# Patient Record
Sex: Female | Born: 1935
Health system: Southern US, Community
[De-identification: ages and names within clinical notes are randomized; demographics above are authoritative.]

## PROBLEM LIST (undated history)

## (undated) DIAGNOSIS — I10 Essential (primary) hypertension: Secondary | ICD-10-CM

## (undated) HISTORY — DX: Essential (primary) hypertension: I10

## (undated) HISTORY — PX: TIBIA FRACTURE SURGERY: SHX806

---

## 2009-07-04 ENCOUNTER — Encounter (HOSPITAL_BASED_OUTPATIENT_CLINIC_OR_DEPARTMENT_OTHER): Admission: RE | Admit: 2009-07-04 | Discharge: 2009-10-02 | Payer: Self-pay | Admitting: General Surgery

## 2009-10-04 ENCOUNTER — Encounter (HOSPITAL_BASED_OUTPATIENT_CLINIC_OR_DEPARTMENT_OTHER): Admission: RE | Admit: 2009-10-04 | Discharge: 2009-11-02 | Payer: Self-pay | Admitting: General Surgery

## 2010-01-29 ENCOUNTER — Encounter: Payer: Self-pay | Admitting: Geriatric Medicine

## 2011-02-15 DIAGNOSIS — E1129 Type 2 diabetes mellitus with other diabetic kidney complication: Secondary | ICD-10-CM | POA: Diagnosis not present

## 2011-02-15 DIAGNOSIS — E1159 Type 2 diabetes mellitus with other circulatory complications: Secondary | ICD-10-CM | POA: Diagnosis not present

## 2011-02-15 DIAGNOSIS — I739 Peripheral vascular disease, unspecified: Secondary | ICD-10-CM | POA: Diagnosis not present

## 2011-02-15 DIAGNOSIS — I129 Hypertensive chronic kidney disease with stage 1 through stage 4 chronic kidney disease, or unspecified chronic kidney disease: Secondary | ICD-10-CM | POA: Diagnosis not present

## 2011-03-05 DIAGNOSIS — E1149 Type 2 diabetes mellitus with other diabetic neurological complication: Secondary | ICD-10-CM | POA: Diagnosis not present

## 2011-03-05 DIAGNOSIS — M2040 Other hammer toe(s) (acquired), unspecified foot: Secondary | ICD-10-CM | POA: Diagnosis not present

## 2011-03-05 DIAGNOSIS — L84 Corns and callosities: Secondary | ICD-10-CM | POA: Diagnosis not present

## 2011-06-06 DIAGNOSIS — Q828 Other specified congenital malformations of skin: Secondary | ICD-10-CM | POA: Diagnosis not present

## 2011-06-06 DIAGNOSIS — E1149 Type 2 diabetes mellitus with other diabetic neurological complication: Secondary | ICD-10-CM | POA: Diagnosis not present

## 2011-06-06 DIAGNOSIS — L608 Other nail disorders: Secondary | ICD-10-CM | POA: Diagnosis not present

## 2011-06-29 DIAGNOSIS — I70299 Other atherosclerosis of native arteries of extremities, unspecified extremity: Secondary | ICD-10-CM | POA: Diagnosis not present

## 2011-06-29 DIAGNOSIS — E1159 Type 2 diabetes mellitus with other circulatory complications: Secondary | ICD-10-CM | POA: Diagnosis not present

## 2011-06-29 DIAGNOSIS — I10 Essential (primary) hypertension: Secondary | ICD-10-CM | POA: Diagnosis not present

## 2011-06-29 DIAGNOSIS — R609 Edema, unspecified: Secondary | ICD-10-CM | POA: Diagnosis not present

## 2011-07-10 DIAGNOSIS — R609 Edema, unspecified: Secondary | ICD-10-CM | POA: Diagnosis not present

## 2011-07-10 DIAGNOSIS — L0390 Cellulitis, unspecified: Secondary | ICD-10-CM | POA: Diagnosis not present

## 2011-07-10 DIAGNOSIS — Z79899 Other long term (current) drug therapy: Secondary | ICD-10-CM | POA: Diagnosis not present

## 2011-07-10 DIAGNOSIS — L0291 Cutaneous abscess, unspecified: Secondary | ICD-10-CM | POA: Diagnosis not present

## 2011-07-24 DIAGNOSIS — I129 Hypertensive chronic kidney disease with stage 1 through stage 4 chronic kidney disease, or unspecified chronic kidney disease: Secondary | ICD-10-CM | POA: Diagnosis not present

## 2011-07-24 DIAGNOSIS — R609 Edema, unspecified: Secondary | ICD-10-CM | POA: Diagnosis not present

## 2011-08-27 DIAGNOSIS — I129 Hypertensive chronic kidney disease with stage 1 through stage 4 chronic kidney disease, or unspecified chronic kidney disease: Secondary | ICD-10-CM | POA: Diagnosis not present

## 2011-08-27 DIAGNOSIS — Z1331 Encounter for screening for depression: Secondary | ICD-10-CM | POA: Diagnosis not present

## 2011-08-27 DIAGNOSIS — E1129 Type 2 diabetes mellitus with other diabetic kidney complication: Secondary | ICD-10-CM | POA: Diagnosis not present

## 2011-08-27 DIAGNOSIS — R609 Edema, unspecified: Secondary | ICD-10-CM | POA: Diagnosis not present

## 2011-09-06 DIAGNOSIS — E1149 Type 2 diabetes mellitus with other diabetic neurological complication: Secondary | ICD-10-CM | POA: Diagnosis not present

## 2011-09-06 DIAGNOSIS — M79609 Pain in unspecified limb: Secondary | ICD-10-CM | POA: Diagnosis not present

## 2011-09-06 DIAGNOSIS — L84 Corns and callosities: Secondary | ICD-10-CM | POA: Diagnosis not present

## 2011-09-06 DIAGNOSIS — B351 Tinea unguium: Secondary | ICD-10-CM | POA: Diagnosis not present

## 2011-11-23 DIAGNOSIS — I129 Hypertensive chronic kidney disease with stage 1 through stage 4 chronic kidney disease, or unspecified chronic kidney disease: Secondary | ICD-10-CM | POA: Diagnosis not present

## 2011-11-23 DIAGNOSIS — Z23 Encounter for immunization: Secondary | ICD-10-CM | POA: Diagnosis not present

## 2011-11-23 DIAGNOSIS — E1129 Type 2 diabetes mellitus with other diabetic kidney complication: Secondary | ICD-10-CM | POA: Diagnosis not present

## 2011-12-05 DIAGNOSIS — Q828 Other specified congenital malformations of skin: Secondary | ICD-10-CM | POA: Diagnosis not present

## 2012-01-11 DIAGNOSIS — H2510 Age-related nuclear cataract, unspecified eye: Secondary | ICD-10-CM | POA: Diagnosis not present

## 2012-04-04 DIAGNOSIS — Z1331 Encounter for screening for depression: Secondary | ICD-10-CM | POA: Diagnosis not present

## 2012-04-04 DIAGNOSIS — Z0000 Encounter for general adult medical examination without abnormal findings: Secondary | ICD-10-CM | POA: Diagnosis not present

## 2012-04-04 DIAGNOSIS — H9190 Unspecified hearing loss, unspecified ear: Secondary | ICD-10-CM | POA: Diagnosis not present

## 2012-04-24 DIAGNOSIS — M79609 Pain in unspecified limb: Secondary | ICD-10-CM | POA: Diagnosis not present

## 2012-04-24 DIAGNOSIS — E1149 Type 2 diabetes mellitus with other diabetic neurological complication: Secondary | ICD-10-CM | POA: Diagnosis not present

## 2012-04-24 DIAGNOSIS — B351 Tinea unguium: Secondary | ICD-10-CM | POA: Diagnosis not present

## 2012-04-24 DIAGNOSIS — Q828 Other specified congenital malformations of skin: Secondary | ICD-10-CM | POA: Diagnosis not present

## 2012-07-02 DIAGNOSIS — E1149 Type 2 diabetes mellitus with other diabetic neurological complication: Secondary | ICD-10-CM | POA: Diagnosis not present

## 2012-07-02 DIAGNOSIS — B351 Tinea unguium: Secondary | ICD-10-CM | POA: Diagnosis not present

## 2012-07-02 DIAGNOSIS — M79609 Pain in unspecified limb: Secondary | ICD-10-CM | POA: Diagnosis not present

## 2012-07-02 DIAGNOSIS — Q828 Other specified congenital malformations of skin: Secondary | ICD-10-CM | POA: Diagnosis not present

## 2012-07-29 DIAGNOSIS — E7800 Pure hypercholesterolemia, unspecified: Secondary | ICD-10-CM | POA: Diagnosis not present

## 2012-07-29 DIAGNOSIS — I129 Hypertensive chronic kidney disease with stage 1 through stage 4 chronic kidney disease, or unspecified chronic kidney disease: Secondary | ICD-10-CM | POA: Diagnosis not present

## 2012-07-29 DIAGNOSIS — I739 Peripheral vascular disease, unspecified: Secondary | ICD-10-CM | POA: Diagnosis not present

## 2012-07-29 DIAGNOSIS — Z79899 Other long term (current) drug therapy: Secondary | ICD-10-CM | POA: Diagnosis not present

## 2012-07-29 DIAGNOSIS — E1129 Type 2 diabetes mellitus with other diabetic kidney complication: Secondary | ICD-10-CM | POA: Diagnosis not present

## 2012-07-29 DIAGNOSIS — I872 Venous insufficiency (chronic) (peripheral): Secondary | ICD-10-CM | POA: Diagnosis not present

## 2012-07-29 DIAGNOSIS — E1159 Type 2 diabetes mellitus with other circulatory complications: Secondary | ICD-10-CM | POA: Diagnosis not present

## 2012-09-03 DIAGNOSIS — M79609 Pain in unspecified limb: Secondary | ICD-10-CM | POA: Diagnosis not present

## 2012-09-03 DIAGNOSIS — B351 Tinea unguium: Secondary | ICD-10-CM | POA: Diagnosis not present

## 2012-09-03 DIAGNOSIS — E1149 Type 2 diabetes mellitus with other diabetic neurological complication: Secondary | ICD-10-CM | POA: Diagnosis not present

## 2012-09-03 DIAGNOSIS — Q828 Other specified congenital malformations of skin: Secondary | ICD-10-CM | POA: Diagnosis not present

## 2012-09-17 DIAGNOSIS — L02419 Cutaneous abscess of limb, unspecified: Secondary | ICD-10-CM | POA: Diagnosis not present

## 2012-09-17 DIAGNOSIS — T148XXA Other injury of unspecified body region, initial encounter: Secondary | ICD-10-CM | POA: Diagnosis not present

## 2012-09-20 DIAGNOSIS — L02419 Cutaneous abscess of limb, unspecified: Secondary | ICD-10-CM | POA: Diagnosis not present

## 2012-10-03 DIAGNOSIS — R609 Edema, unspecified: Secondary | ICD-10-CM | POA: Diagnosis not present

## 2012-10-03 DIAGNOSIS — E119 Type 2 diabetes mellitus without complications: Secondary | ICD-10-CM | POA: Diagnosis not present

## 2012-10-03 DIAGNOSIS — T148XXA Other injury of unspecified body region, initial encounter: Secondary | ICD-10-CM | POA: Diagnosis not present

## 2012-10-03 DIAGNOSIS — I10 Essential (primary) hypertension: Secondary | ICD-10-CM | POA: Diagnosis not present

## 2012-11-04 DIAGNOSIS — Z23 Encounter for immunization: Secondary | ICD-10-CM | POA: Diagnosis not present

## 2012-11-04 DIAGNOSIS — R5381 Other malaise: Secondary | ICD-10-CM | POA: Diagnosis not present

## 2012-11-04 DIAGNOSIS — I872 Venous insufficiency (chronic) (peripheral): Secondary | ICD-10-CM | POA: Diagnosis not present

## 2012-11-04 DIAGNOSIS — I83009 Varicose veins of unspecified lower extremity with ulcer of unspecified site: Secondary | ICD-10-CM | POA: Diagnosis not present

## 2012-11-26 ENCOUNTER — Ambulatory Visit (INDEPENDENT_AMBULATORY_CARE_PROVIDER_SITE_OTHER): Payer: Medicare Other | Admitting: Podiatry

## 2012-11-26 ENCOUNTER — Encounter: Payer: Self-pay | Admitting: Podiatry

## 2012-11-26 VITALS — BP 163/84 | HR 74 | Resp 16

## 2012-11-26 DIAGNOSIS — B351 Tinea unguium: Secondary | ICD-10-CM

## 2012-11-26 DIAGNOSIS — Q828 Other specified congenital malformations of skin: Secondary | ICD-10-CM | POA: Diagnosis not present

## 2012-11-26 DIAGNOSIS — E1159 Type 2 diabetes mellitus with other circulatory complications: Secondary | ICD-10-CM | POA: Diagnosis not present

## 2012-11-26 DIAGNOSIS — M79609 Pain in unspecified limb: Secondary | ICD-10-CM | POA: Diagnosis not present

## 2012-11-26 NOTE — Progress Notes (Signed)
Subjective:     Patient ID: Valerie Garrison, female   DOB: 1916-08-18, 79 y.o.   MRN: [id number]  HPI patient states my nails are thickened and painful on both feet   Review of Systems     Objective:   Physical Exam Neurovascular status unchanged with thick deformed nailbeds 1-5 both feet that are impossible for her to cut and painful    Assessment:     Mycotic nail infection with pain 1-5 both feet    Plan:     Debridement painful nail bed 1-5 both feet with no bleeding noted

## 2013-02-25 ENCOUNTER — Ambulatory Visit: Payer: Medicare Other | Admitting: Podiatry

## 2013-03-04 ENCOUNTER — Ambulatory Visit: Payer: Medicare Other | Admitting: Podiatry

## 2013-03-23 ENCOUNTER — Encounter: Payer: Self-pay | Admitting: Podiatry

## 2013-03-23 ENCOUNTER — Ambulatory Visit (INDEPENDENT_AMBULATORY_CARE_PROVIDER_SITE_OTHER): Payer: Medicare Other | Admitting: Podiatry

## 2013-03-23 VITALS — BP 132/60 | HR 80 | Resp 12

## 2013-03-23 DIAGNOSIS — B351 Tinea unguium: Secondary | ICD-10-CM | POA: Diagnosis not present

## 2013-03-23 DIAGNOSIS — L84 Corns and callosities: Secondary | ICD-10-CM

## 2013-03-23 DIAGNOSIS — M79609 Pain in unspecified limb: Secondary | ICD-10-CM

## 2013-03-25 DIAGNOSIS — Z23 Encounter for immunization: Secondary | ICD-10-CM | POA: Diagnosis not present

## 2013-03-25 DIAGNOSIS — I70299 Other atherosclerosis of native arteries of extremities, unspecified extremity: Secondary | ICD-10-CM | POA: Diagnosis not present

## 2013-03-25 DIAGNOSIS — E1159 Type 2 diabetes mellitus with other circulatory complications: Secondary | ICD-10-CM | POA: Diagnosis not present

## 2013-03-25 DIAGNOSIS — I10 Essential (primary) hypertension: Secondary | ICD-10-CM | POA: Diagnosis not present

## 2013-03-25 NOTE — Progress Notes (Signed)
Subjective:     Patient ID: Valerie Garrison, female   DOB: [birth year], 80 y.o.   MRN: [id number]  HPI patient presents with painful thick nailbeds 1-5 both feet that she cannot cut them lesions under both feet that she can not take care of   Review of Systems     Objective:   Physical Exam Neurovascular status intact with nail disease 1-5 both feet that are painful and lesion left    Assessment:     Mycotic nail infection 1-5 both feet and lesion left foot    Plan:     Debridement painful nailbeds 1-5 both feet with no bleeding and lesion left with no bleeding noted

## 2013-04-02 DIAGNOSIS — W19XXXA Unspecified fall, initial encounter: Secondary | ICD-10-CM | POA: Diagnosis not present

## 2013-04-02 DIAGNOSIS — M79609 Pain in unspecified limb: Secondary | ICD-10-CM | POA: Diagnosis not present

## 2013-04-02 DIAGNOSIS — R0902 Hypoxemia: Secondary | ICD-10-CM | POA: Diagnosis not present

## 2013-04-02 DIAGNOSIS — T8062XA Other serum reaction due to vaccination, initial encounter: Secondary | ICD-10-CM | POA: Diagnosis not present

## 2013-04-02 DIAGNOSIS — I499 Cardiac arrhythmia, unspecified: Secondary | ICD-10-CM | POA: Diagnosis not present

## 2013-05-05 DIAGNOSIS — L98499 Non-pressure chronic ulcer of skin of other sites with unspecified severity: Secondary | ICD-10-CM | POA: Diagnosis not present

## 2013-05-05 DIAGNOSIS — L0291 Cutaneous abscess, unspecified: Secondary | ICD-10-CM | POA: Diagnosis not present

## 2013-05-05 DIAGNOSIS — L0390 Cellulitis, unspecified: Secondary | ICD-10-CM | POA: Diagnosis not present

## 2013-05-06 DIAGNOSIS — I83009 Varicose veins of unspecified lower extremity with ulcer of unspecified site: Secondary | ICD-10-CM | POA: Diagnosis not present

## 2013-05-06 DIAGNOSIS — R5383 Other fatigue: Secondary | ICD-10-CM | POA: Diagnosis not present

## 2013-05-06 DIAGNOSIS — L97909 Non-pressure chronic ulcer of unspecified part of unspecified lower leg with unspecified severity: Secondary | ICD-10-CM | POA: Diagnosis not present

## 2013-05-06 DIAGNOSIS — R269 Unspecified abnormalities of gait and mobility: Secondary | ICD-10-CM | POA: Diagnosis not present

## 2013-05-06 DIAGNOSIS — R5381 Other malaise: Secondary | ICD-10-CM | POA: Diagnosis not present

## 2013-05-15 ENCOUNTER — Ambulatory Visit
Admission: RE | Admit: 2013-05-15 | Discharge: 2013-05-15 | Disposition: A | Discharge status: Home / Self Care | Payer: Medicare Other | Source: Ambulatory Visit | Attending: Geriatric Medicine | Admitting: Geriatric Medicine

## 2013-05-15 ENCOUNTER — Other Ambulatory Visit: Payer: Self-pay | Admitting: Geriatric Medicine

## 2013-05-15 DIAGNOSIS — I83009 Varicose veins of unspecified lower extremity with ulcer of unspecified site: Secondary | ICD-10-CM | POA: Diagnosis not present

## 2013-05-15 DIAGNOSIS — M7989 Other specified soft tissue disorders: Secondary | ICD-10-CM | POA: Diagnosis not present

## 2013-05-15 DIAGNOSIS — M79604 Pain in right leg: Secondary | ICD-10-CM

## 2013-05-15 DIAGNOSIS — M79609 Pain in unspecified limb: Secondary | ICD-10-CM | POA: Diagnosis not present

## 2013-05-18 DIAGNOSIS — I129 Hypertensive chronic kidney disease with stage 1 through stage 4 chronic kidney disease, or unspecified chronic kidney disease: Secondary | ICD-10-CM | POA: Diagnosis not present

## 2013-05-18 DIAGNOSIS — N183 Chronic kidney disease, stage 3 unspecified: Secondary | ICD-10-CM | POA: Diagnosis not present

## 2013-05-18 DIAGNOSIS — E1159 Type 2 diabetes mellitus with other circulatory complications: Secondary | ICD-10-CM | POA: Diagnosis not present

## 2013-05-18 DIAGNOSIS — IMO0001 Reserved for inherently not codable concepts without codable children: Secondary | ICD-10-CM | POA: Diagnosis not present

## 2013-05-18 DIAGNOSIS — R269 Unspecified abnormalities of gait and mobility: Secondary | ICD-10-CM | POA: Diagnosis not present

## 2013-05-18 DIAGNOSIS — I798 Other disorders of arteries, arterioles and capillaries in diseases classified elsewhere: Secondary | ICD-10-CM | POA: Diagnosis not present

## 2013-05-20 DIAGNOSIS — IMO0001 Reserved for inherently not codable concepts without codable children: Secondary | ICD-10-CM | POA: Diagnosis not present

## 2013-05-20 DIAGNOSIS — N183 Chronic kidney disease, stage 3 unspecified: Secondary | ICD-10-CM | POA: Diagnosis not present

## 2013-05-20 DIAGNOSIS — E1159 Type 2 diabetes mellitus with other circulatory complications: Secondary | ICD-10-CM | POA: Diagnosis not present

## 2013-05-20 DIAGNOSIS — I129 Hypertensive chronic kidney disease with stage 1 through stage 4 chronic kidney disease, or unspecified chronic kidney disease: Secondary | ICD-10-CM | POA: Diagnosis not present

## 2013-05-20 DIAGNOSIS — I798 Other disorders of arteries, arterioles and capillaries in diseases classified elsewhere: Secondary | ICD-10-CM | POA: Diagnosis not present

## 2013-05-20 DIAGNOSIS — R269 Unspecified abnormalities of gait and mobility: Secondary | ICD-10-CM | POA: Diagnosis not present

## 2013-05-22 DIAGNOSIS — I129 Hypertensive chronic kidney disease with stage 1 through stage 4 chronic kidney disease, or unspecified chronic kidney disease: Secondary | ICD-10-CM | POA: Diagnosis not present

## 2013-05-22 DIAGNOSIS — IMO0001 Reserved for inherently not codable concepts without codable children: Secondary | ICD-10-CM | POA: Diagnosis not present

## 2013-05-22 DIAGNOSIS — R269 Unspecified abnormalities of gait and mobility: Secondary | ICD-10-CM | POA: Diagnosis not present

## 2013-05-22 DIAGNOSIS — E1159 Type 2 diabetes mellitus with other circulatory complications: Secondary | ICD-10-CM | POA: Diagnosis not present

## 2013-05-22 DIAGNOSIS — I798 Other disorders of arteries, arterioles and capillaries in diseases classified elsewhere: Secondary | ICD-10-CM | POA: Diagnosis not present

## 2013-05-22 DIAGNOSIS — N183 Chronic kidney disease, stage 3 unspecified: Secondary | ICD-10-CM | POA: Diagnosis not present

## 2013-05-25 DIAGNOSIS — I129 Hypertensive chronic kidney disease with stage 1 through stage 4 chronic kidney disease, or unspecified chronic kidney disease: Secondary | ICD-10-CM | POA: Diagnosis not present

## 2013-05-25 DIAGNOSIS — E1159 Type 2 diabetes mellitus with other circulatory complications: Secondary | ICD-10-CM | POA: Diagnosis not present

## 2013-05-25 DIAGNOSIS — IMO0001 Reserved for inherently not codable concepts without codable children: Secondary | ICD-10-CM | POA: Diagnosis not present

## 2013-05-25 DIAGNOSIS — I798 Other disorders of arteries, arterioles and capillaries in diseases classified elsewhere: Secondary | ICD-10-CM | POA: Diagnosis not present

## 2013-05-25 DIAGNOSIS — R269 Unspecified abnormalities of gait and mobility: Secondary | ICD-10-CM | POA: Diagnosis not present

## 2013-05-25 DIAGNOSIS — N183 Chronic kidney disease, stage 3 unspecified: Secondary | ICD-10-CM | POA: Diagnosis not present

## 2013-05-27 DIAGNOSIS — I798 Other disorders of arteries, arterioles and capillaries in diseases classified elsewhere: Secondary | ICD-10-CM | POA: Diagnosis not present

## 2013-05-27 DIAGNOSIS — IMO0001 Reserved for inherently not codable concepts without codable children: Secondary | ICD-10-CM | POA: Diagnosis not present

## 2013-05-27 DIAGNOSIS — R269 Unspecified abnormalities of gait and mobility: Secondary | ICD-10-CM | POA: Diagnosis not present

## 2013-05-27 DIAGNOSIS — I129 Hypertensive chronic kidney disease with stage 1 through stage 4 chronic kidney disease, or unspecified chronic kidney disease: Secondary | ICD-10-CM | POA: Diagnosis not present

## 2013-05-27 DIAGNOSIS — N183 Chronic kidney disease, stage 3 unspecified: Secondary | ICD-10-CM | POA: Diagnosis not present

## 2013-05-27 DIAGNOSIS — E1159 Type 2 diabetes mellitus with other circulatory complications: Secondary | ICD-10-CM | POA: Diagnosis not present

## 2013-05-29 DIAGNOSIS — L988 Other specified disorders of the skin and subcutaneous tissue: Secondary | ICD-10-CM | POA: Diagnosis present

## 2013-05-29 DIAGNOSIS — I129 Hypertensive chronic kidney disease with stage 1 through stage 4 chronic kidney disease, or unspecified chronic kidney disease: Secondary | ICD-10-CM | POA: Diagnosis present

## 2013-05-29 DIAGNOSIS — I798 Other disorders of arteries, arterioles and capillaries in diseases classified elsewhere: Secondary | ICD-10-CM | POA: Diagnosis not present

## 2013-05-29 DIAGNOSIS — I83009 Varicose veins of unspecified lower extremity with ulcer of unspecified site: Secondary | ICD-10-CM | POA: Diagnosis not present

## 2013-05-29 DIAGNOSIS — Z79899 Other long term (current) drug therapy: Secondary | ICD-10-CM | POA: Diagnosis not present

## 2013-05-29 DIAGNOSIS — IMO0001 Reserved for inherently not codable concepts without codable children: Secondary | ICD-10-CM | POA: Diagnosis not present

## 2013-05-29 DIAGNOSIS — N183 Chronic kidney disease, stage 3 unspecified: Secondary | ICD-10-CM | POA: Diagnosis not present

## 2013-05-29 DIAGNOSIS — R51 Headache: Secondary | ICD-10-CM | POA: Diagnosis not present

## 2013-05-29 DIAGNOSIS — B370 Candidal stomatitis: Secondary | ICD-10-CM | POA: Diagnosis present

## 2013-05-29 DIAGNOSIS — R5383 Other fatigue: Secondary | ICD-10-CM | POA: Diagnosis not present

## 2013-05-29 DIAGNOSIS — R0602 Shortness of breath: Secondary | ICD-10-CM | POA: Diagnosis not present

## 2013-05-29 DIAGNOSIS — R4182 Altered mental status, unspecified: Secondary | ICD-10-CM | POA: Diagnosis not present

## 2013-05-29 DIAGNOSIS — R7309 Other abnormal glucose: Secondary | ICD-10-CM | POA: Diagnosis not present

## 2013-05-29 DIAGNOSIS — I872 Venous insufficiency (chronic) (peripheral): Secondary | ICD-10-CM | POA: Diagnosis present

## 2013-05-29 DIAGNOSIS — G9341 Metabolic encephalopathy: Secondary | ICD-10-CM | POA: Diagnosis not present

## 2013-05-29 DIAGNOSIS — L02419 Cutaneous abscess of limb, unspecified: Secondary | ICD-10-CM | POA: Diagnosis not present

## 2013-05-29 DIAGNOSIS — L97909 Non-pressure chronic ulcer of unspecified part of unspecified lower leg with unspecified severity: Secondary | ICD-10-CM | POA: Diagnosis not present

## 2013-05-29 DIAGNOSIS — R5381 Other malaise: Secondary | ICD-10-CM | POA: Diagnosis not present

## 2013-05-29 DIAGNOSIS — N179 Acute kidney failure, unspecified: Secondary | ICD-10-CM | POA: Diagnosis present

## 2013-05-29 DIAGNOSIS — E1159 Type 2 diabetes mellitus with other circulatory complications: Secondary | ICD-10-CM | POA: Diagnosis not present

## 2013-05-29 DIAGNOSIS — F29 Unspecified psychosis not due to a substance or known physiological condition: Secondary | ICD-10-CM | POA: Diagnosis not present

## 2013-05-29 DIAGNOSIS — G3184 Mild cognitive impairment, so stated: Secondary | ICD-10-CM | POA: Diagnosis present

## 2013-05-29 DIAGNOSIS — R059 Cough, unspecified: Secondary | ICD-10-CM | POA: Diagnosis not present

## 2013-05-29 DIAGNOSIS — Z7982 Long term (current) use of aspirin: Secondary | ICD-10-CM | POA: Diagnosis not present

## 2013-05-29 DIAGNOSIS — R269 Unspecified abnormalities of gait and mobility: Secondary | ICD-10-CM | POA: Diagnosis not present

## 2013-06-01 DIAGNOSIS — I10 Essential (primary) hypertension: Secondary | ICD-10-CM | POA: Diagnosis not present

## 2013-06-01 DIAGNOSIS — Z7982 Long term (current) use of aspirin: Secondary | ICD-10-CM | POA: Diagnosis not present

## 2013-06-01 DIAGNOSIS — R7309 Other abnormal glucose: Secondary | ICD-10-CM | POA: Diagnosis not present

## 2013-06-01 DIAGNOSIS — IMO0001 Reserved for inherently not codable concepts without codable children: Secondary | ICD-10-CM | POA: Diagnosis not present

## 2013-06-01 DIAGNOSIS — E119 Type 2 diabetes mellitus without complications: Secondary | ICD-10-CM | POA: Diagnosis not present

## 2013-06-01 DIAGNOSIS — R609 Edema, unspecified: Secondary | ICD-10-CM | POA: Diagnosis not present

## 2013-06-01 DIAGNOSIS — Z794 Long term (current) use of insulin: Secondary | ICD-10-CM | POA: Diagnosis not present

## 2013-06-01 DIAGNOSIS — N183 Chronic kidney disease, stage 3 unspecified: Secondary | ICD-10-CM | POA: Diagnosis not present

## 2013-06-01 DIAGNOSIS — E1159 Type 2 diabetes mellitus with other circulatory complications: Secondary | ICD-10-CM | POA: Diagnosis not present

## 2013-06-01 DIAGNOSIS — I798 Other disorders of arteries, arterioles and capillaries in diseases classified elsewhere: Secondary | ICD-10-CM | POA: Diagnosis not present

## 2013-06-01 DIAGNOSIS — R269 Unspecified abnormalities of gait and mobility: Secondary | ICD-10-CM | POA: Diagnosis not present

## 2013-06-01 DIAGNOSIS — N289 Disorder of kidney and ureter, unspecified: Secondary | ICD-10-CM | POA: Diagnosis not present

## 2013-06-01 DIAGNOSIS — N390 Urinary tract infection, site not specified: Secondary | ICD-10-CM | POA: Diagnosis not present

## 2013-06-01 DIAGNOSIS — F29 Unspecified psychosis not due to a substance or known physiological condition: Secondary | ICD-10-CM | POA: Diagnosis not present

## 2013-06-01 DIAGNOSIS — I129 Hypertensive chronic kidney disease with stage 1 through stage 4 chronic kidney disease, or unspecified chronic kidney disease: Secondary | ICD-10-CM | POA: Diagnosis not present

## 2013-06-01 DIAGNOSIS — Z79899 Other long term (current) drug therapy: Secondary | ICD-10-CM | POA: Diagnosis not present

## 2013-06-01 DIAGNOSIS — R631 Polydipsia: Secondary | ICD-10-CM | POA: Diagnosis not present

## 2013-06-02 DIAGNOSIS — N183 Chronic kidney disease, stage 3 unspecified: Secondary | ICD-10-CM | POA: Diagnosis not present

## 2013-06-02 DIAGNOSIS — R269 Unspecified abnormalities of gait and mobility: Secondary | ICD-10-CM | POA: Diagnosis not present

## 2013-06-02 DIAGNOSIS — I798 Other disorders of arteries, arterioles and capillaries in diseases classified elsewhere: Secondary | ICD-10-CM | POA: Diagnosis not present

## 2013-06-02 DIAGNOSIS — IMO0001 Reserved for inherently not codable concepts without codable children: Secondary | ICD-10-CM | POA: Diagnosis not present

## 2013-06-02 DIAGNOSIS — E1159 Type 2 diabetes mellitus with other circulatory complications: Secondary | ICD-10-CM | POA: Diagnosis not present

## 2013-06-02 DIAGNOSIS — I129 Hypertensive chronic kidney disease with stage 1 through stage 4 chronic kidney disease, or unspecified chronic kidney disease: Secondary | ICD-10-CM | POA: Diagnosis not present

## 2013-06-03 DIAGNOSIS — E1159 Type 2 diabetes mellitus with other circulatory complications: Secondary | ICD-10-CM | POA: Diagnosis not present

## 2013-06-03 DIAGNOSIS — L03119 Cellulitis of unspecified part of limb: Secondary | ICD-10-CM | POA: Diagnosis not present

## 2013-06-03 DIAGNOSIS — R413 Other amnesia: Secondary | ICD-10-CM | POA: Diagnosis not present

## 2013-06-03 DIAGNOSIS — I129 Hypertensive chronic kidney disease with stage 1 through stage 4 chronic kidney disease, or unspecified chronic kidney disease: Secondary | ICD-10-CM | POA: Diagnosis not present

## 2013-06-03 DIAGNOSIS — I70299 Other atherosclerosis of native arteries of extremities, unspecified extremity: Secondary | ICD-10-CM | POA: Diagnosis not present

## 2013-06-03 DIAGNOSIS — N183 Chronic kidney disease, stage 3 unspecified: Secondary | ICD-10-CM | POA: Diagnosis not present

## 2013-06-03 DIAGNOSIS — L02419 Cutaneous abscess of limb, unspecified: Secondary | ICD-10-CM | POA: Diagnosis not present

## 2013-06-03 DIAGNOSIS — E1129 Type 2 diabetes mellitus with other diabetic kidney complication: Secondary | ICD-10-CM | POA: Diagnosis not present

## 2013-06-05 DIAGNOSIS — E1159 Type 2 diabetes mellitus with other circulatory complications: Secondary | ICD-10-CM | POA: Diagnosis not present

## 2013-06-05 DIAGNOSIS — R269 Unspecified abnormalities of gait and mobility: Secondary | ICD-10-CM | POA: Diagnosis not present

## 2013-06-05 DIAGNOSIS — IMO0001 Reserved for inherently not codable concepts without codable children: Secondary | ICD-10-CM | POA: Diagnosis not present

## 2013-06-05 DIAGNOSIS — I129 Hypertensive chronic kidney disease with stage 1 through stage 4 chronic kidney disease, or unspecified chronic kidney disease: Secondary | ICD-10-CM | POA: Diagnosis not present

## 2013-06-05 DIAGNOSIS — I798 Other disorders of arteries, arterioles and capillaries in diseases classified elsewhere: Secondary | ICD-10-CM | POA: Diagnosis not present

## 2013-06-05 DIAGNOSIS — N183 Chronic kidney disease, stage 3 unspecified: Secondary | ICD-10-CM | POA: Diagnosis not present

## 2013-06-08 DIAGNOSIS — I798 Other disorders of arteries, arterioles and capillaries in diseases classified elsewhere: Secondary | ICD-10-CM | POA: Diagnosis not present

## 2013-06-08 DIAGNOSIS — E1159 Type 2 diabetes mellitus with other circulatory complications: Secondary | ICD-10-CM | POA: Diagnosis not present

## 2013-06-08 DIAGNOSIS — I129 Hypertensive chronic kidney disease with stage 1 through stage 4 chronic kidney disease, or unspecified chronic kidney disease: Secondary | ICD-10-CM | POA: Diagnosis not present

## 2013-06-08 DIAGNOSIS — N183 Chronic kidney disease, stage 3 unspecified: Secondary | ICD-10-CM | POA: Diagnosis not present

## 2013-06-08 DIAGNOSIS — R269 Unspecified abnormalities of gait and mobility: Secondary | ICD-10-CM | POA: Diagnosis not present

## 2013-06-08 DIAGNOSIS — IMO0001 Reserved for inherently not codable concepts without codable children: Secondary | ICD-10-CM | POA: Diagnosis not present

## 2013-06-10 DIAGNOSIS — N183 Chronic kidney disease, stage 3 unspecified: Secondary | ICD-10-CM | POA: Diagnosis not present

## 2013-06-10 DIAGNOSIS — R269 Unspecified abnormalities of gait and mobility: Secondary | ICD-10-CM | POA: Diagnosis not present

## 2013-06-10 DIAGNOSIS — I129 Hypertensive chronic kidney disease with stage 1 through stage 4 chronic kidney disease, or unspecified chronic kidney disease: Secondary | ICD-10-CM | POA: Diagnosis not present

## 2013-06-10 DIAGNOSIS — E1159 Type 2 diabetes mellitus with other circulatory complications: Secondary | ICD-10-CM | POA: Diagnosis not present

## 2013-06-10 DIAGNOSIS — I798 Other disorders of arteries, arterioles and capillaries in diseases classified elsewhere: Secondary | ICD-10-CM | POA: Diagnosis not present

## 2013-06-10 DIAGNOSIS — IMO0001 Reserved for inherently not codable concepts without codable children: Secondary | ICD-10-CM | POA: Diagnosis not present

## 2013-06-11 DIAGNOSIS — I798 Other disorders of arteries, arterioles and capillaries in diseases classified elsewhere: Secondary | ICD-10-CM | POA: Diagnosis not present

## 2013-06-11 DIAGNOSIS — IMO0001 Reserved for inherently not codable concepts without codable children: Secondary | ICD-10-CM | POA: Diagnosis not present

## 2013-06-11 DIAGNOSIS — N183 Chronic kidney disease, stage 3 unspecified: Secondary | ICD-10-CM | POA: Diagnosis not present

## 2013-06-11 DIAGNOSIS — R269 Unspecified abnormalities of gait and mobility: Secondary | ICD-10-CM | POA: Diagnosis not present

## 2013-06-11 DIAGNOSIS — E1159 Type 2 diabetes mellitus with other circulatory complications: Secondary | ICD-10-CM | POA: Diagnosis not present

## 2013-06-11 DIAGNOSIS — I129 Hypertensive chronic kidney disease with stage 1 through stage 4 chronic kidney disease, or unspecified chronic kidney disease: Secondary | ICD-10-CM | POA: Diagnosis not present

## 2013-06-16 DIAGNOSIS — I798 Other disorders of arteries, arterioles and capillaries in diseases classified elsewhere: Secondary | ICD-10-CM | POA: Diagnosis not present

## 2013-06-16 DIAGNOSIS — N183 Chronic kidney disease, stage 3 unspecified: Secondary | ICD-10-CM | POA: Diagnosis not present

## 2013-06-16 DIAGNOSIS — IMO0001 Reserved for inherently not codable concepts without codable children: Secondary | ICD-10-CM | POA: Diagnosis not present

## 2013-06-16 DIAGNOSIS — I129 Hypertensive chronic kidney disease with stage 1 through stage 4 chronic kidney disease, or unspecified chronic kidney disease: Secondary | ICD-10-CM | POA: Diagnosis not present

## 2013-06-16 DIAGNOSIS — R269 Unspecified abnormalities of gait and mobility: Secondary | ICD-10-CM | POA: Diagnosis not present

## 2013-06-16 DIAGNOSIS — E1159 Type 2 diabetes mellitus with other circulatory complications: Secondary | ICD-10-CM | POA: Diagnosis not present

## 2013-06-17 DIAGNOSIS — I872 Venous insufficiency (chronic) (peripheral): Secondary | ICD-10-CM | POA: Diagnosis not present

## 2013-06-17 DIAGNOSIS — N183 Chronic kidney disease, stage 3 unspecified: Secondary | ICD-10-CM | POA: Diagnosis not present

## 2013-06-17 DIAGNOSIS — I129 Hypertensive chronic kidney disease with stage 1 through stage 4 chronic kidney disease, or unspecified chronic kidney disease: Secondary | ICD-10-CM | POA: Diagnosis not present

## 2013-06-17 DIAGNOSIS — E1129 Type 2 diabetes mellitus with other diabetic kidney complication: Secondary | ICD-10-CM | POA: Diagnosis not present

## 2013-06-18 DIAGNOSIS — IMO0001 Reserved for inherently not codable concepts without codable children: Secondary | ICD-10-CM | POA: Diagnosis not present

## 2013-06-18 DIAGNOSIS — I129 Hypertensive chronic kidney disease with stage 1 through stage 4 chronic kidney disease, or unspecified chronic kidney disease: Secondary | ICD-10-CM | POA: Diagnosis not present

## 2013-06-18 DIAGNOSIS — I798 Other disorders of arteries, arterioles and capillaries in diseases classified elsewhere: Secondary | ICD-10-CM | POA: Diagnosis not present

## 2013-06-18 DIAGNOSIS — R269 Unspecified abnormalities of gait and mobility: Secondary | ICD-10-CM | POA: Diagnosis not present

## 2013-06-18 DIAGNOSIS — N183 Chronic kidney disease, stage 3 unspecified: Secondary | ICD-10-CM | POA: Diagnosis not present

## 2013-06-18 DIAGNOSIS — E1159 Type 2 diabetes mellitus with other circulatory complications: Secondary | ICD-10-CM | POA: Diagnosis not present

## 2013-06-19 DIAGNOSIS — IMO0001 Reserved for inherently not codable concepts without codable children: Secondary | ICD-10-CM | POA: Diagnosis not present

## 2013-06-19 DIAGNOSIS — N183 Chronic kidney disease, stage 3 unspecified: Secondary | ICD-10-CM | POA: Diagnosis not present

## 2013-06-19 DIAGNOSIS — I798 Other disorders of arteries, arterioles and capillaries in diseases classified elsewhere: Secondary | ICD-10-CM | POA: Diagnosis not present

## 2013-06-19 DIAGNOSIS — I129 Hypertensive chronic kidney disease with stage 1 through stage 4 chronic kidney disease, or unspecified chronic kidney disease: Secondary | ICD-10-CM | POA: Diagnosis not present

## 2013-06-19 DIAGNOSIS — R269 Unspecified abnormalities of gait and mobility: Secondary | ICD-10-CM | POA: Diagnosis not present

## 2013-06-19 DIAGNOSIS — E1159 Type 2 diabetes mellitus with other circulatory complications: Secondary | ICD-10-CM | POA: Diagnosis not present

## 2013-06-22 DIAGNOSIS — I798 Other disorders of arteries, arterioles and capillaries in diseases classified elsewhere: Secondary | ICD-10-CM | POA: Diagnosis not present

## 2013-06-22 DIAGNOSIS — IMO0001 Reserved for inherently not codable concepts without codable children: Secondary | ICD-10-CM | POA: Diagnosis not present

## 2013-06-22 DIAGNOSIS — R269 Unspecified abnormalities of gait and mobility: Secondary | ICD-10-CM | POA: Diagnosis not present

## 2013-06-22 DIAGNOSIS — I129 Hypertensive chronic kidney disease with stage 1 through stage 4 chronic kidney disease, or unspecified chronic kidney disease: Secondary | ICD-10-CM | POA: Diagnosis not present

## 2013-06-22 DIAGNOSIS — N183 Chronic kidney disease, stage 3 unspecified: Secondary | ICD-10-CM | POA: Diagnosis not present

## 2013-06-22 DIAGNOSIS — E1159 Type 2 diabetes mellitus with other circulatory complications: Secondary | ICD-10-CM | POA: Diagnosis not present

## 2013-06-23 DIAGNOSIS — I798 Other disorders of arteries, arterioles and capillaries in diseases classified elsewhere: Secondary | ICD-10-CM | POA: Diagnosis not present

## 2013-06-23 DIAGNOSIS — I129 Hypertensive chronic kidney disease with stage 1 through stage 4 chronic kidney disease, or unspecified chronic kidney disease: Secondary | ICD-10-CM | POA: Diagnosis not present

## 2013-06-23 DIAGNOSIS — N183 Chronic kidney disease, stage 3 unspecified: Secondary | ICD-10-CM | POA: Diagnosis not present

## 2013-06-23 DIAGNOSIS — IMO0001 Reserved for inherently not codable concepts without codable children: Secondary | ICD-10-CM | POA: Diagnosis not present

## 2013-06-23 DIAGNOSIS — E1159 Type 2 diabetes mellitus with other circulatory complications: Secondary | ICD-10-CM | POA: Diagnosis not present

## 2013-06-23 DIAGNOSIS — R269 Unspecified abnormalities of gait and mobility: Secondary | ICD-10-CM | POA: Diagnosis not present

## 2013-06-24 ENCOUNTER — Ambulatory Visit: Payer: Medicare Other | Admitting: Podiatry

## 2013-06-25 DIAGNOSIS — I129 Hypertensive chronic kidney disease with stage 1 through stage 4 chronic kidney disease, or unspecified chronic kidney disease: Secondary | ICD-10-CM | POA: Diagnosis not present

## 2013-06-25 DIAGNOSIS — I798 Other disorders of arteries, arterioles and capillaries in diseases classified elsewhere: Secondary | ICD-10-CM | POA: Diagnosis not present

## 2013-06-25 DIAGNOSIS — IMO0001 Reserved for inherently not codable concepts without codable children: Secondary | ICD-10-CM | POA: Diagnosis not present

## 2013-06-25 DIAGNOSIS — R269 Unspecified abnormalities of gait and mobility: Secondary | ICD-10-CM | POA: Diagnosis not present

## 2013-06-25 DIAGNOSIS — N183 Chronic kidney disease, stage 3 unspecified: Secondary | ICD-10-CM | POA: Diagnosis not present

## 2013-06-25 DIAGNOSIS — E1159 Type 2 diabetes mellitus with other circulatory complications: Secondary | ICD-10-CM | POA: Diagnosis not present

## 2013-06-30 DIAGNOSIS — I129 Hypertensive chronic kidney disease with stage 1 through stage 4 chronic kidney disease, or unspecified chronic kidney disease: Secondary | ICD-10-CM | POA: Diagnosis not present

## 2013-06-30 DIAGNOSIS — N183 Chronic kidney disease, stage 3 unspecified: Secondary | ICD-10-CM | POA: Diagnosis not present

## 2013-06-30 DIAGNOSIS — E1159 Type 2 diabetes mellitus with other circulatory complications: Secondary | ICD-10-CM | POA: Diagnosis not present

## 2013-06-30 DIAGNOSIS — I798 Other disorders of arteries, arterioles and capillaries in diseases classified elsewhere: Secondary | ICD-10-CM | POA: Diagnosis not present

## 2013-06-30 DIAGNOSIS — IMO0001 Reserved for inherently not codable concepts without codable children: Secondary | ICD-10-CM | POA: Diagnosis not present

## 2013-06-30 DIAGNOSIS — R269 Unspecified abnormalities of gait and mobility: Secondary | ICD-10-CM | POA: Diagnosis not present

## 2013-07-02 ENCOUNTER — Ambulatory Visit (INDEPENDENT_AMBULATORY_CARE_PROVIDER_SITE_OTHER): Payer: Medicare Other | Admitting: Podiatry

## 2013-07-02 DIAGNOSIS — M79609 Pain in unspecified limb: Secondary | ICD-10-CM | POA: Diagnosis not present

## 2013-07-02 DIAGNOSIS — B351 Tinea unguium: Secondary | ICD-10-CM

## 2013-07-02 DIAGNOSIS — L84 Corns and callosities: Secondary | ICD-10-CM | POA: Diagnosis not present

## 2013-07-02 DIAGNOSIS — M79673 Pain in unspecified foot: Secondary | ICD-10-CM

## 2013-07-02 NOTE — Progress Notes (Signed)
Subjective:     Patient ID: Valerie Garrison, female   DOB: 1916-08-13, 80 y.o.   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

## 2013-07-03 DIAGNOSIS — R269 Unspecified abnormalities of gait and mobility: Secondary | ICD-10-CM | POA: Diagnosis not present

## 2013-07-03 DIAGNOSIS — N183 Chronic kidney disease, stage 3 unspecified: Secondary | ICD-10-CM | POA: Diagnosis not present

## 2013-07-03 DIAGNOSIS — I129 Hypertensive chronic kidney disease with stage 1 through stage 4 chronic kidney disease, or unspecified chronic kidney disease: Secondary | ICD-10-CM | POA: Diagnosis not present

## 2013-07-03 DIAGNOSIS — E1159 Type 2 diabetes mellitus with other circulatory complications: Secondary | ICD-10-CM | POA: Diagnosis not present

## 2013-07-03 DIAGNOSIS — I798 Other disorders of arteries, arterioles and capillaries in diseases classified elsewhere: Secondary | ICD-10-CM | POA: Diagnosis not present

## 2013-07-03 DIAGNOSIS — IMO0001 Reserved for inherently not codable concepts without codable children: Secondary | ICD-10-CM | POA: Diagnosis not present

## 2013-07-14 DIAGNOSIS — R269 Unspecified abnormalities of gait and mobility: Secondary | ICD-10-CM | POA: Diagnosis not present

## 2013-07-14 DIAGNOSIS — E1159 Type 2 diabetes mellitus with other circulatory complications: Secondary | ICD-10-CM | POA: Diagnosis not present

## 2013-07-14 DIAGNOSIS — IMO0001 Reserved for inherently not codable concepts without codable children: Secondary | ICD-10-CM | POA: Diagnosis not present

## 2013-07-14 DIAGNOSIS — I129 Hypertensive chronic kidney disease with stage 1 through stage 4 chronic kidney disease, or unspecified chronic kidney disease: Secondary | ICD-10-CM | POA: Diagnosis not present

## 2013-07-14 DIAGNOSIS — N183 Chronic kidney disease, stage 3 unspecified: Secondary | ICD-10-CM | POA: Diagnosis not present

## 2013-07-14 DIAGNOSIS — I798 Other disorders of arteries, arterioles and capillaries in diseases classified elsewhere: Secondary | ICD-10-CM | POA: Diagnosis not present

## 2013-08-19 DIAGNOSIS — H2510 Age-related nuclear cataract, unspecified eye: Secondary | ICD-10-CM | POA: Diagnosis not present

## 2013-09-21 ENCOUNTER — Ambulatory Visit (INDEPENDENT_AMBULATORY_CARE_PROVIDER_SITE_OTHER): Payer: Medicare Other | Admitting: Podiatry

## 2013-09-21 DIAGNOSIS — B351 Tinea unguium: Secondary | ICD-10-CM | POA: Diagnosis not present

## 2013-09-21 DIAGNOSIS — L84 Corns and callosities: Secondary | ICD-10-CM

## 2013-09-21 DIAGNOSIS — M79609 Pain in unspecified limb: Secondary | ICD-10-CM

## 2013-09-21 DIAGNOSIS — M79673 Pain in unspecified foot: Secondary | ICD-10-CM

## 2013-09-21 NOTE — Patient Instructions (Signed)
Diabetes and Foot Care Diabetes may cause you to have problems because of poor blood supply (circulation) to your feet and legs. This may cause the skin on your feet to become thinner, break easier, and heal more slowly. Your skin may become dry, and the skin may peel and crack. You may also have nerve damage in your legs and feet causing decreased feeling in them. You may not notice minor injuries to your feet that could lead to infections or more serious problems. Taking care of your feet is one of the most important things you can do for yourself.  HOME CARE INSTRUCTIONS  Wear shoes at all times, even in the house. Do not go barefoot. Bare feet are easily injured.  Check your feet daily for blisters, cuts, and redness. If you cannot see the bottom of your feet, use a mirror or ask someone for help.  Wash your feet with warm water (do not use hot water) and mild soap. Then pat your feet and the areas between your toes until they are completely dry. Do not soak your feet as this can dry your skin.  Apply a moisturizing lotion or petroleum jelly (that does not contain alcohol and is unscented) to the skin on your feet and to dry, brittle toenails. Do not apply lotion between your toes.  Trim your toenails straight across. Do not dig under them or around the cuticle. File the edges of your nails with an emery board or nail file.  Do not cut corns or calluses or try to remove them with medicine.  Wear clean socks or stockings every day. Make sure they are not too tight. Do not wear knee-high stockings since they may decrease blood flow to your legs.  Wear shoes that fit properly and have enough cushioning. To break in new shoes, wear them for just a few hours a day. This prevents you from injuring your feet. Always look in your shoes before you put them on to be sure there are no objects inside.  Do not cross your legs. This may decrease the blood flow to your feet.  If you find a minor scrape,  cut, or break in the skin on your feet, keep it and the skin around it clean and dry. These areas may be cleansed with mild soap and water. Do not cleanse the area with peroxide, alcohol, or iodine.  When you remove an adhesive bandage, be sure not to damage the skin around it.  If you have a wound, look at it several times a day to make sure it is healing.  Do not use heating pads or hot water bottles. They may burn your skin. If you have lost feeling in your feet or legs, you may not know it is happening until it is too late.  Make sure your health care provider performs a complete foot exam at least annually or more often if you have foot problems. Report any cuts, sores, or bruises to your health care provider immediately. SEEK MEDICAL CARE IF:   You have an injury that is not healing.  You have cuts or breaks in the skin.  You have an ingrown nail.  You notice redness on your legs or feet.  You feel burning or tingling in your legs or feet.  You have pain or cramps in your legs and feet.  Your legs or feet are numb.  Your feet always feel cold. SEEK IMMEDIATE MEDICAL CARE IF:   There is increasing redness,   swelling, or pain in or around a wound.  There is a red line that goes up your leg.  Pus is coming from a wound.  You develop a fever or as directed by your health care provider.  You notice a bad smell coming from an ulcer or wound. Document Released: 12/23/1999 Document Revised: 08/27/2012 Document Reviewed: 06/03/2012 [employer] Patient Information 2015 [employer]. This information is not intended to replace advice given to you by your health care provider. Make sure you discuss any questions you have with your health care provider.  

## 2013-09-21 NOTE — Progress Notes (Signed)
   Subjective:    Patient ID: Valerie Garrison, female    DOB: 04/27/1916, 80 y.o.   MRN: [id number]  HPI  This patient presents with her daughter granddaughter complaining of painful toenails and distal keratoses Review of Systems     Objective:   Physical Exam  Toenails are incurvated, elongated, hypertrophic 6-10 Distal keratoses 3, 4 right and 3 left Hammertoe deformities 2-5 bilaterally     Assessment & Plan:   Assessment: Symptomatic onychomycoses 6-10 Keratoses x3  Plan: Nails x10 and keratoses x3 debrided with pinpoint bleeding noted distal third left toe. Betadine solution and Band-Aid applied to the distal third left toe  Reappoint at three-month intervals

## 2013-09-23 DIAGNOSIS — Z23 Encounter for immunization: Secondary | ICD-10-CM | POA: Diagnosis not present

## 2013-09-23 DIAGNOSIS — Z1331 Encounter for screening for depression: Secondary | ICD-10-CM | POA: Diagnosis not present

## 2013-09-23 DIAGNOSIS — E1159 Type 2 diabetes mellitus with other circulatory complications: Secondary | ICD-10-CM | POA: Diagnosis not present

## 2013-09-23 DIAGNOSIS — N183 Chronic kidney disease, stage 3 unspecified: Secondary | ICD-10-CM | POA: Diagnosis not present

## 2013-09-23 DIAGNOSIS — Z79899 Other long term (current) drug therapy: Secondary | ICD-10-CM | POA: Diagnosis not present

## 2013-09-23 DIAGNOSIS — I70299 Other atherosclerosis of native arteries of extremities, unspecified extremity: Secondary | ICD-10-CM | POA: Diagnosis not present

## 2013-09-23 DIAGNOSIS — I129 Hypertensive chronic kidney disease with stage 1 through stage 4 chronic kidney disease, or unspecified chronic kidney disease: Secondary | ICD-10-CM | POA: Diagnosis not present

## 2013-09-23 DIAGNOSIS — Z0000 Encounter for general adult medical examination without abnormal findings: Secondary | ICD-10-CM | POA: Diagnosis not present

## 2013-09-23 DIAGNOSIS — E1129 Type 2 diabetes mellitus with other diabetic kidney complication: Secondary | ICD-10-CM | POA: Diagnosis not present

## 2013-10-19 DIAGNOSIS — N289 Disorder of kidney and ureter, unspecified: Secondary | ICD-10-CM | POA: Diagnosis not present

## 2013-10-19 DIAGNOSIS — R531 Weakness: Secondary | ICD-10-CM | POA: Diagnosis not present

## 2013-10-19 DIAGNOSIS — R5381 Other malaise: Secondary | ICD-10-CM | POA: Diagnosis not present

## 2013-10-19 DIAGNOSIS — F419 Anxiety disorder, unspecified: Secondary | ICD-10-CM | POA: Diagnosis not present

## 2013-10-19 DIAGNOSIS — Z7982 Long term (current) use of aspirin: Secondary | ICD-10-CM | POA: Diagnosis not present

## 2013-10-19 DIAGNOSIS — Z79899 Other long term (current) drug therapy: Secondary | ICD-10-CM | POA: Diagnosis not present

## 2013-10-19 DIAGNOSIS — L03115 Cellulitis of right lower limb: Secondary | ICD-10-CM | POA: Diagnosis not present

## 2013-10-19 DIAGNOSIS — I10 Essential (primary) hypertension: Secondary | ICD-10-CM | POA: Diagnosis not present

## 2013-10-19 DIAGNOSIS — Z794 Long term (current) use of insulin: Secondary | ICD-10-CM | POA: Diagnosis not present

## 2013-10-19 DIAGNOSIS — E119 Type 2 diabetes mellitus without complications: Secondary | ICD-10-CM | POA: Diagnosis not present

## 2013-10-27 DIAGNOSIS — I129 Hypertensive chronic kidney disease with stage 1 through stage 4 chronic kidney disease, or unspecified chronic kidney disease: Secondary | ICD-10-CM | POA: Diagnosis not present

## 2013-10-27 DIAGNOSIS — N183 Chronic kidney disease, stage 3 (moderate): Secondary | ICD-10-CM | POA: Diagnosis not present

## 2013-10-27 DIAGNOSIS — E1122 Type 2 diabetes mellitus with diabetic chronic kidney disease: Secondary | ICD-10-CM | POA: Diagnosis not present

## 2013-10-27 DIAGNOSIS — T148 Other injury of unspecified body region: Secondary | ICD-10-CM | POA: Diagnosis not present

## 2013-10-27 DIAGNOSIS — E1151 Type 2 diabetes mellitus with diabetic peripheral angiopathy without gangrene: Secondary | ICD-10-CM | POA: Diagnosis not present

## 2013-12-21 ENCOUNTER — Ambulatory Visit (INDEPENDENT_AMBULATORY_CARE_PROVIDER_SITE_OTHER): Payer: Medicare Other | Admitting: Podiatry

## 2013-12-21 ENCOUNTER — Encounter: Payer: Self-pay | Admitting: Podiatry

## 2013-12-21 DIAGNOSIS — B351 Tinea unguium: Secondary | ICD-10-CM

## 2013-12-21 DIAGNOSIS — E1151 Type 2 diabetes mellitus with diabetic peripheral angiopathy without gangrene: Secondary | ICD-10-CM

## 2013-12-21 DIAGNOSIS — Q828 Other specified congenital malformations of skin: Secondary | ICD-10-CM | POA: Diagnosis not present

## 2013-12-21 DIAGNOSIS — M79673 Pain in unspecified foot: Secondary | ICD-10-CM

## 2013-12-21 NOTE — Progress Notes (Signed)
Subjective:     Patient ID: Valerie Garrison, female   DOB: 30-Apr-1916, 80 y.o.   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

## 2013-12-21 NOTE — Patient Instructions (Signed)
Diabetes and Foot Care Diabetes may cause you to have problems because of poor blood supply (circulation) to your feet and legs. This may cause the skin on your feet to become thinner, break easier, and heal more slowly. Your skin may become dry, and the skin may peel and crack. You may also have nerve damage in your legs and feet causing decreased feeling in them. You may not notice minor injuries to your feet that could lead to infections or more serious problems. Taking care of your feet is one of the most important things you can do for yourself.  HOME CARE INSTRUCTIONS  Wear shoes at all times, even in the house. Do not go barefoot. Bare feet are easily injured.  Check your feet daily for blisters, cuts, and redness. If you cannot see the bottom of your feet, use a mirror or ask someone for help.  Wash your feet with warm water (do not use hot water) and mild soap. Then pat your feet and the areas between your toes until they are completely dry. Do not soak your feet as this can dry your skin.  Apply a moisturizing lotion or petroleum jelly (that does not contain alcohol and is unscented) to the skin on your feet and to dry, brittle toenails. Do not apply lotion between your toes.  Trim your toenails straight across. Do not dig under them or around the cuticle. File the edges of your nails with an emery board or nail file.  Do not cut corns or calluses or try to remove them with medicine.  Wear clean socks or stockings every day. Make sure they are not too tight. Do not wear knee-high stockings since they may decrease blood flow to your legs.  Wear shoes that fit properly and have enough cushioning. To break in new shoes, wear them for just a few hours a day. This prevents you from injuring your feet. Always look in your shoes before you put them on to be sure there are no objects inside.  Do not cross your legs. This may decrease the blood flow to your feet.  If you find a minor scrape,  cut, or break in the skin on your feet, keep it and the skin around it clean and dry. These areas may be cleansed with mild soap and water. Do not cleanse the area with peroxide, alcohol, or iodine.  When you remove an adhesive bandage, be sure not to damage the skin around it.  If you have a wound, look at it several times a day to make sure it is healing.  Do not use heating pads or hot water bottles. They may burn your skin. If you have lost feeling in your feet or legs, you may not know it is happening until it is too late.  Make sure your health care provider performs a complete foot exam at least annually or more often if you have foot problems. Report any cuts, sores, or bruises to your health care provider immediately. SEEK MEDICAL CARE IF:   You have an injury that is not healing.  You have cuts or breaks in the skin.  You have an ingrown nail.  You notice redness on your legs or feet.  You feel burning or tingling in your legs or feet.  You have pain or cramps in your legs and feet.  Your legs or feet are numb.  Your feet always feel cold. SEEK IMMEDIATE MEDICAL CARE IF:   There is increasing redness,   swelling, or pain in or around a wound.  There is a red line that goes up your leg.  Pus is coming from a wound.  You develop a fever or as directed by your health care provider.  You notice a bad smell coming from an ulcer or wound. Document Released: 12/23/1999 Document Revised: 08/27/2012 Document Reviewed: 06/03/2012 [employer] Patient Information 2015 [employer]. This information is not intended to replace advice given to you by your health care provider. Make sure you discuss any questions you have with your health care provider.  

## 2013-12-23 DIAGNOSIS — T148 Other injury of unspecified body region: Secondary | ICD-10-CM | POA: Diagnosis not present

## 2013-12-23 DIAGNOSIS — N183 Chronic kidney disease, stage 3 (moderate): Secondary | ICD-10-CM | POA: Diagnosis not present

## 2013-12-23 DIAGNOSIS — I129 Hypertensive chronic kidney disease with stage 1 through stage 4 chronic kidney disease, or unspecified chronic kidney disease: Secondary | ICD-10-CM | POA: Diagnosis not present

## 2013-12-23 DIAGNOSIS — E1151 Type 2 diabetes mellitus with diabetic peripheral angiopathy without gangrene: Secondary | ICD-10-CM | POA: Diagnosis not present

## 2013-12-23 DIAGNOSIS — E1122 Type 2 diabetes mellitus with diabetic chronic kidney disease: Secondary | ICD-10-CM | POA: Diagnosis not present

## 2014-03-12 DIAGNOSIS — E1122 Type 2 diabetes mellitus with diabetic chronic kidney disease: Secondary | ICD-10-CM | POA: Diagnosis not present

## 2014-03-12 DIAGNOSIS — D62 Acute posthemorrhagic anemia: Secondary | ICD-10-CM | POA: Diagnosis not present

## 2014-03-12 DIAGNOSIS — K5900 Constipation, unspecified: Secondary | ICD-10-CM | POA: Diagnosis not present

## 2014-03-12 DIAGNOSIS — I129 Hypertensive chronic kidney disease with stage 1 through stage 4 chronic kidney disease, or unspecified chronic kidney disease: Secondary | ICD-10-CM | POA: Diagnosis present

## 2014-03-12 DIAGNOSIS — R0602 Shortness of breath: Secondary | ICD-10-CM | POA: Diagnosis not present

## 2014-03-12 DIAGNOSIS — R911 Solitary pulmonary nodule: Secondary | ICD-10-CM | POA: Diagnosis not present

## 2014-03-12 DIAGNOSIS — Z79899 Other long term (current) drug therapy: Secondary | ICD-10-CM | POA: Diagnosis not present

## 2014-03-12 DIAGNOSIS — R918 Other nonspecific abnormal finding of lung field: Secondary | ICD-10-CM | POA: Diagnosis present

## 2014-03-12 DIAGNOSIS — R54 Age-related physical debility: Secondary | ICD-10-CM | POA: Diagnosis not present

## 2014-03-12 DIAGNOSIS — I10 Essential (primary) hypertension: Secondary | ICD-10-CM | POA: Diagnosis not present

## 2014-03-12 DIAGNOSIS — N184 Chronic kidney disease, stage 4 (severe): Secondary | ICD-10-CM | POA: Diagnosis not present

## 2014-03-12 DIAGNOSIS — Z7982 Long term (current) use of aspirin: Secondary | ICD-10-CM | POA: Diagnosis not present

## 2014-03-12 DIAGNOSIS — K922 Gastrointestinal hemorrhage, unspecified: Secondary | ICD-10-CM | POA: Diagnosis not present

## 2014-03-12 DIAGNOSIS — G3184 Mild cognitive impairment, so stated: Secondary | ICD-10-CM | POA: Diagnosis not present

## 2014-03-12 DIAGNOSIS — K8020 Calculus of gallbladder without cholecystitis without obstruction: Secondary | ICD-10-CM | POA: Diagnosis not present

## 2014-03-12 DIAGNOSIS — K5731 Diverticulosis of large intestine without perforation or abscess with bleeding: Secondary | ICD-10-CM | POA: Diagnosis not present

## 2014-03-12 DIAGNOSIS — E1165 Type 2 diabetes mellitus with hyperglycemia: Secondary | ICD-10-CM | POA: Diagnosis not present

## 2014-03-12 DIAGNOSIS — Z794 Long term (current) use of insulin: Secondary | ICD-10-CM | POA: Diagnosis not present

## 2014-03-18 DIAGNOSIS — K922 Gastrointestinal hemorrhage, unspecified: Secondary | ICD-10-CM | POA: Diagnosis not present

## 2014-03-18 DIAGNOSIS — I129 Hypertensive chronic kidney disease with stage 1 through stage 4 chronic kidney disease, or unspecified chronic kidney disease: Secondary | ICD-10-CM | POA: Diagnosis not present

## 2014-03-18 DIAGNOSIS — Z9181 History of falling: Secondary | ICD-10-CM | POA: Diagnosis not present

## 2014-03-18 DIAGNOSIS — E119 Type 2 diabetes mellitus without complications: Secondary | ICD-10-CM | POA: Diagnosis not present

## 2014-03-22 ENCOUNTER — Ambulatory Visit (INDEPENDENT_AMBULATORY_CARE_PROVIDER_SITE_OTHER): Payer: Medicare Other | Admitting: Podiatry

## 2014-03-22 ENCOUNTER — Encounter: Payer: Self-pay | Admitting: Podiatry

## 2014-03-22 DIAGNOSIS — M79673 Pain in unspecified foot: Secondary | ICD-10-CM

## 2014-03-22 DIAGNOSIS — B351 Tinea unguium: Secondary | ICD-10-CM

## 2014-03-22 DIAGNOSIS — Q828 Other specified congenital malformations of skin: Secondary | ICD-10-CM

## 2014-03-22 DIAGNOSIS — L84 Corns and callosities: Secondary | ICD-10-CM

## 2014-03-22 NOTE — Progress Notes (Signed)
Subjective:     Patient ID: Valerie Garrison, female   DOB: 1916-02-19, 81 y.o.   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

## 2014-04-21 DIAGNOSIS — N183 Chronic kidney disease, stage 3 (moderate): Secondary | ICD-10-CM | POA: Diagnosis not present

## 2014-04-21 DIAGNOSIS — I129 Hypertensive chronic kidney disease with stage 1 through stage 4 chronic kidney disease, or unspecified chronic kidney disease: Secondary | ICD-10-CM | POA: Diagnosis not present

## 2014-04-21 DIAGNOSIS — E1122 Type 2 diabetes mellitus with diabetic chronic kidney disease: Secondary | ICD-10-CM | POA: Diagnosis not present

## 2014-04-21 DIAGNOSIS — Z8719 Personal history of other diseases of the digestive system: Secondary | ICD-10-CM | POA: Diagnosis not present

## 2014-04-21 DIAGNOSIS — E1151 Type 2 diabetes mellitus with diabetic peripheral angiopathy without gangrene: Secondary | ICD-10-CM | POA: Diagnosis not present

## 2014-04-21 DIAGNOSIS — D696 Thrombocytopenia, unspecified: Secondary | ICD-10-CM | POA: Diagnosis not present

## 2014-04-26 DIAGNOSIS — R8290 Unspecified abnormal findings in urine: Secondary | ICD-10-CM | POA: Diagnosis not present

## 2014-04-26 DIAGNOSIS — E162 Hypoglycemia, unspecified: Secondary | ICD-10-CM | POA: Diagnosis not present

## 2014-04-26 DIAGNOSIS — I129 Hypertensive chronic kidney disease with stage 1 through stage 4 chronic kidney disease, or unspecified chronic kidney disease: Secondary | ICD-10-CM | POA: Diagnosis not present

## 2014-04-26 DIAGNOSIS — N183 Chronic kidney disease, stage 3 (moderate): Secondary | ICD-10-CM | POA: Diagnosis not present

## 2014-04-26 DIAGNOSIS — E1122 Type 2 diabetes mellitus with diabetic chronic kidney disease: Secondary | ICD-10-CM | POA: Diagnosis not present

## 2014-05-03 DIAGNOSIS — M25561 Pain in right knee: Secondary | ICD-10-CM | POA: Diagnosis not present

## 2014-05-03 DIAGNOSIS — M25562 Pain in left knee: Secondary | ICD-10-CM | POA: Diagnosis not present

## 2014-05-05 DIAGNOSIS — M170 Bilateral primary osteoarthritis of knee: Secondary | ICD-10-CM | POA: Diagnosis not present

## 2014-06-02 IMAGING — US US EXTREM LOW VENOUS*R*
1 series · 13 of 24 positions shown · non-contrast
Comparison: None.

CLINICAL DATA: Right lower extremity pain and edema.



[Series 1: us extrem low venous*right* · 13 of 31 slices shown]
[im 1/31]
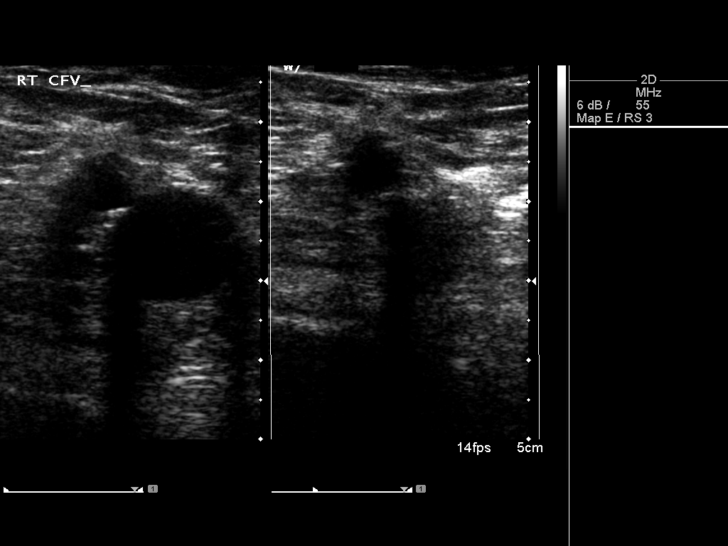
[im 3/31]
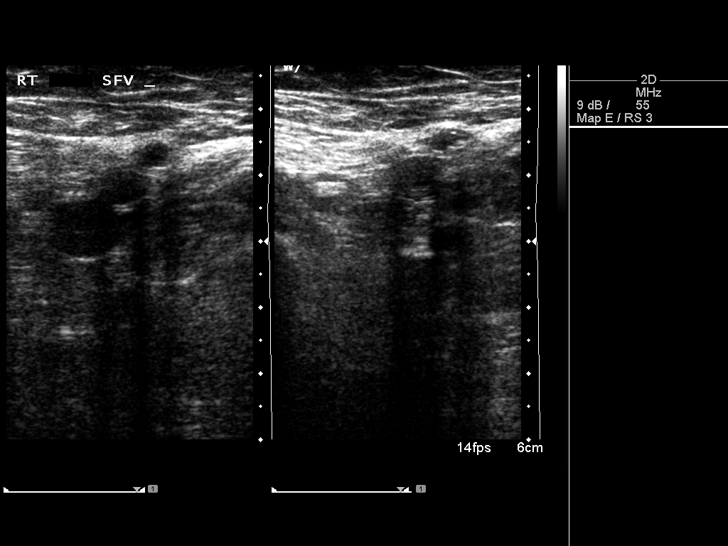
[im 6/31]
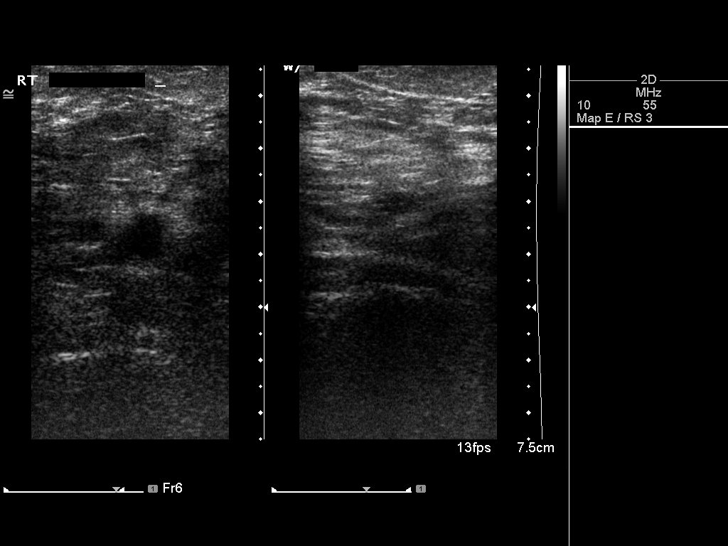
[im 8/31]
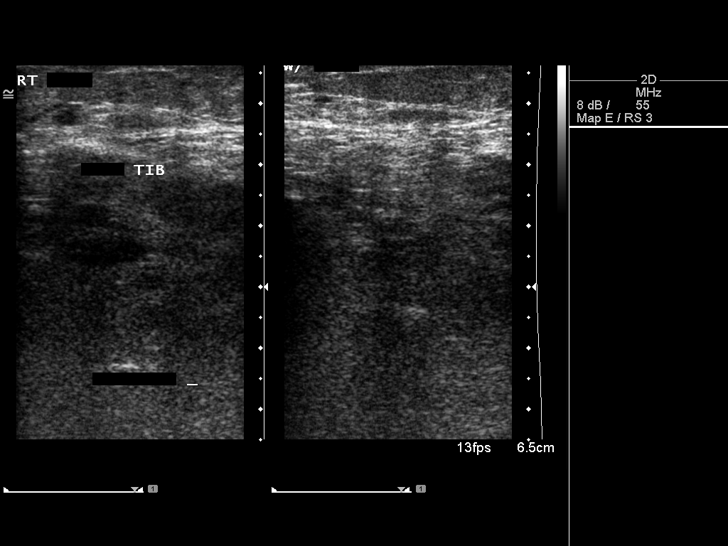
[im 11/31]
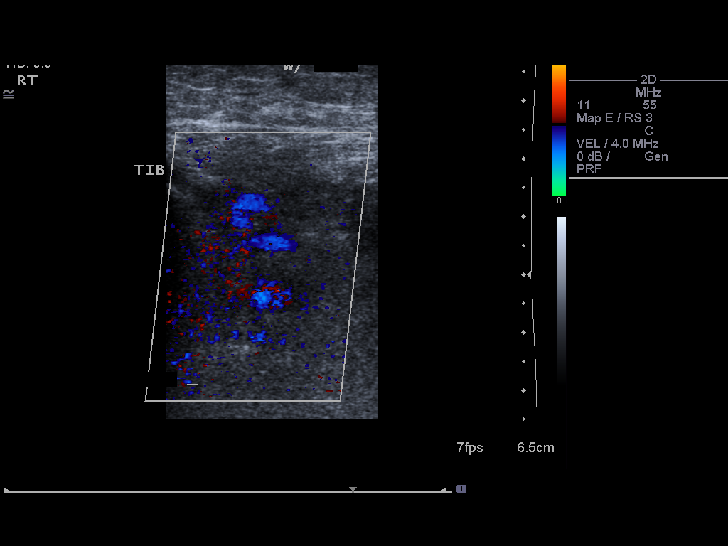
[im 14/31]
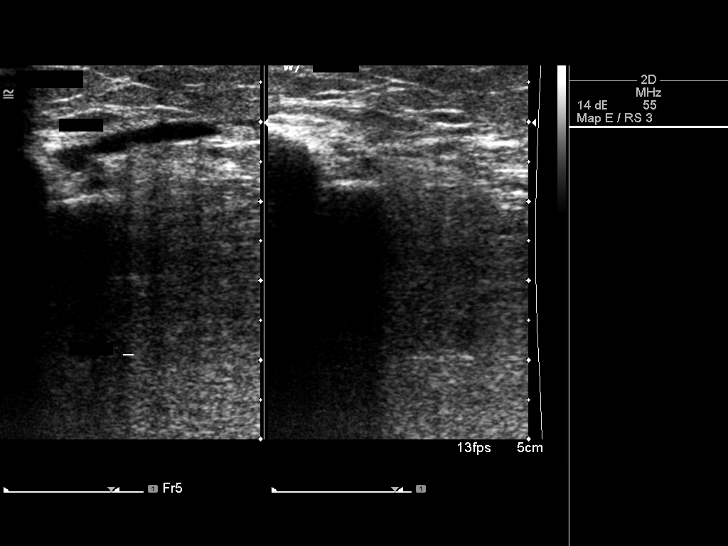
[im 16/31]
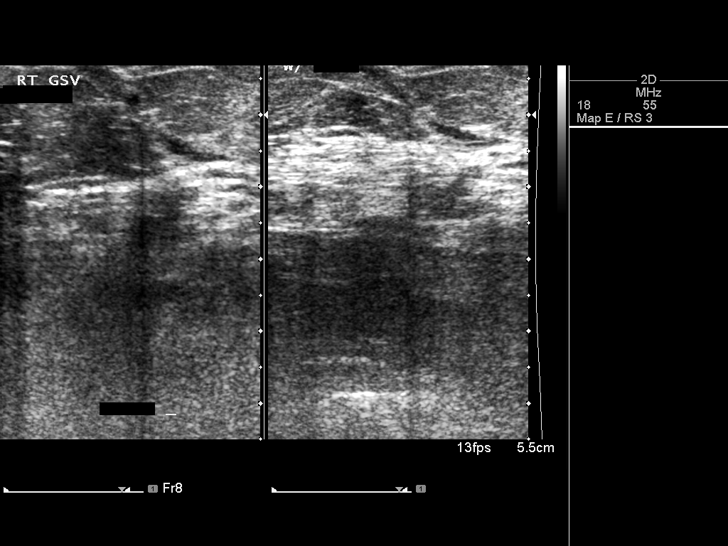
[im 17/31]
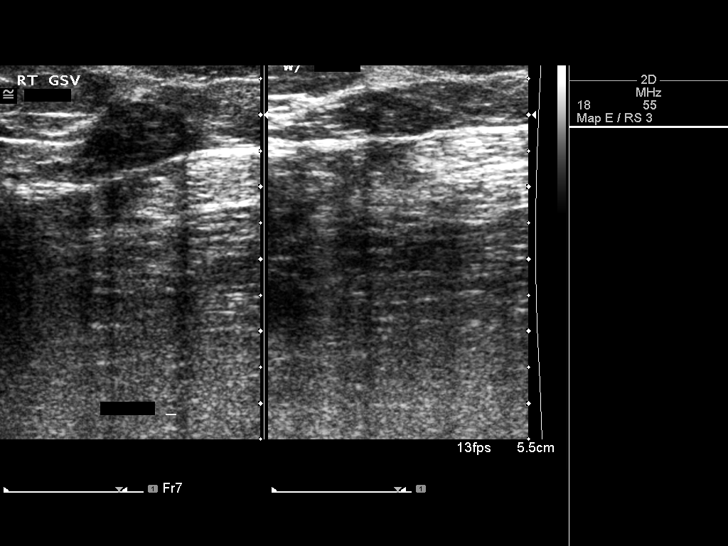
[im 20/31]
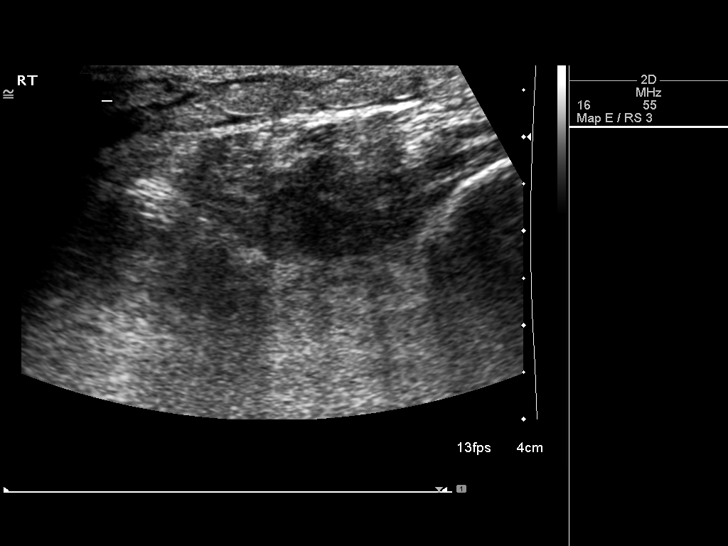
[im 23/31]
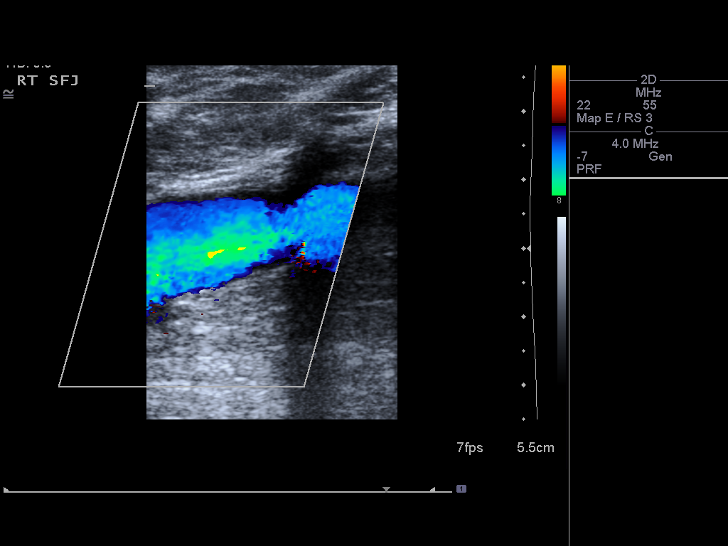
[im 25/31]
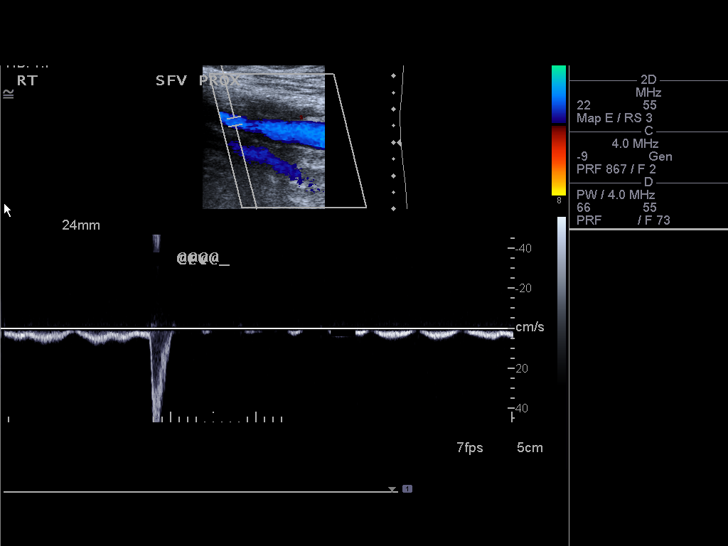
[im 28/31]
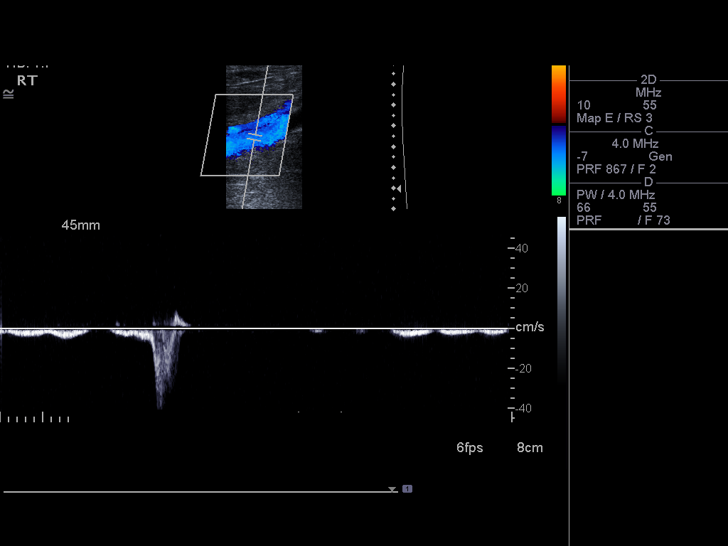
[im 31/31]
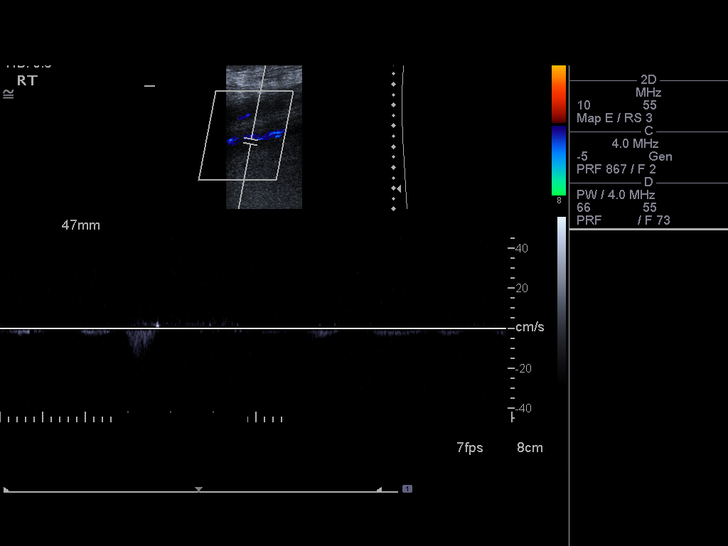

[13 of 24 positions shown; findings below may reference images not displayed]

FINDINGS: Common Femoral Vein: No evidence of thrombus. Normal
compressibility, respiratory phasicity and response to augmentation.

Saphenofemoral Junction: No evidence of thrombus. Normal
compressibility and flow on color Doppler imaging.

Profunda Femoral Vein: No evidence of thrombus. Normal
compressibility and flow on color Doppler imaging.

Femoral Vein: No evidence of thrombus. Normal compressibility,
respiratory phasicity and response to augmentation.

Popliteal Vein: No evidence of thrombus. Normal compressibility,
respiratory phasicity and response to augmentation.

Calf Veins: No evidence of thrombus. Normal compressibility and flow
on color Doppler imaging.

Superficial Great Saphenous Vein: There is no evidence of thrombus
in the GSV. Visualized segments of the GSV show wall thickening,
suggestive of either prior thrombophlebitis or potentially changes
related to prior varicose vein therapy.

Venous Reflux:  None.

Other Findings: No evidence of superficial thrombophlebitis or
abnormal fluid collection.
IMPRESSION: No evidence of right lower extremity deep venous thrombosis. Changes
in the GSV suggestive of either prior thrombophlebitis or prior
venous treatment.

## 2014-06-09 DIAGNOSIS — M170 Bilateral primary osteoarthritis of knee: Secondary | ICD-10-CM | POA: Diagnosis not present

## 2014-07-02 DIAGNOSIS — N183 Chronic kidney disease, stage 3 (moderate): Secondary | ICD-10-CM | POA: Diagnosis not present

## 2014-07-02 DIAGNOSIS — I129 Hypertensive chronic kidney disease with stage 1 through stage 4 chronic kidney disease, or unspecified chronic kidney disease: Secondary | ICD-10-CM | POA: Diagnosis not present

## 2014-07-02 DIAGNOSIS — Z794 Long term (current) use of insulin: Secondary | ICD-10-CM | POA: Diagnosis not present

## 2014-07-02 DIAGNOSIS — E1151 Type 2 diabetes mellitus with diabetic peripheral angiopathy without gangrene: Secondary | ICD-10-CM | POA: Diagnosis not present

## 2014-07-02 DIAGNOSIS — R269 Unspecified abnormalities of gait and mobility: Secondary | ICD-10-CM | POA: Diagnosis not present

## 2014-07-19 ENCOUNTER — Ambulatory Visit: Payer: Medicare Other

## 2014-07-20 ENCOUNTER — Ambulatory Visit (INDEPENDENT_AMBULATORY_CARE_PROVIDER_SITE_OTHER): Payer: Medicare Other | Admitting: Podiatry

## 2014-07-20 ENCOUNTER — Encounter: Payer: Self-pay | Admitting: Podiatry

## 2014-07-20 DIAGNOSIS — B351 Tinea unguium: Secondary | ICD-10-CM

## 2014-07-20 DIAGNOSIS — M79673 Pain in unspecified foot: Secondary | ICD-10-CM | POA: Diagnosis not present

## 2014-07-20 DIAGNOSIS — E1151 Type 2 diabetes mellitus with diabetic peripheral angiopathy without gangrene: Secondary | ICD-10-CM

## 2014-07-20 NOTE — Progress Notes (Signed)
Patient ID: Valerie Garrison, female   DOB: [birth year], 81 y.o.   MRN: [id number] HPI  Complaint:  Visit Type: Patient returns to my office for continued preventative foot care services. Complaint: Patient states" my nails have grown long and thick and become painful to walk and wear shoes" Patient has been diagnosed with DM with no complications. He presents for preventative foot care services. No changes to ROS.  She also has painful corns third toes both feet. Podiatric Exam: Vascular: dorsalis pedis and posterior tibial pulses are negative. Capillary return is immediate. Temperature gradient is negative. Skin turgor WNL, bilateral swelling  Sensorium: Normal Semmes Weinstein monofilament test. Normal tactile sensation bilaterally.  Nail Exam: Pt has thick disfigured discolored nails with subungual debris noted bilateral entire nail hallux through fifth toenails Ulcer Exam: There is no evidence of ulcer or pre-ulcerative changes or infection. Orthopedic Exam: Muscle tone and strength are WNL. No limitations in general ROM. No crepitus or effusions noted. Foot type and digits show no abnormalities. Bony prominences are unremarkable. Severe HAV B/L Skin: No Porokeratosis. No infection or ulcers.  Distal clavi 3rd toes both feet. Hyperkeratosis right heel.  Diagnosis:  Tinea unguium, Pain in right toe, pain in left toes  Treatment & Plan Procedures and Treatment: Consent by patient was obtained for treatment procedures. The patient understood the discussion of treatment and procedures well. All questions were answered thoroughly reviewed. Debridement of mycotic and hypertrophic toenails, 1 through 5 bilateral and clearing of subungual debris. No ulceration, no infection noted.  Return Visit-Office Procedure: Patient instructed to return to the office for a follow up visit 3 months for continued evaluation and treatment.

## 2014-08-10 DIAGNOSIS — M170 Bilateral primary osteoarthritis of knee: Secondary | ICD-10-CM | POA: Diagnosis not present

## 2014-09-07 DIAGNOSIS — Z135 Encounter for screening for eye and ear disorders: Secondary | ICD-10-CM | POA: Diagnosis not present

## 2014-09-07 DIAGNOSIS — H04123 Dry eye syndrome of bilateral lacrimal glands: Secondary | ICD-10-CM | POA: Diagnosis not present

## 2014-09-07 DIAGNOSIS — H2513 Age-related nuclear cataract, bilateral: Secondary | ICD-10-CM | POA: Diagnosis not present

## 2014-09-07 DIAGNOSIS — H02403 Unspecified ptosis of bilateral eyelids: Secondary | ICD-10-CM | POA: Diagnosis not present

## 2014-09-23 DIAGNOSIS — R195 Other fecal abnormalities: Secondary | ICD-10-CM | POA: Diagnosis not present

## 2014-09-23 DIAGNOSIS — K625 Hemorrhage of anus and rectum: Secondary | ICD-10-CM | POA: Diagnosis not present

## 2014-09-27 DIAGNOSIS — K625 Hemorrhage of anus and rectum: Secondary | ICD-10-CM | POA: Diagnosis not present

## 2014-10-13 DIAGNOSIS — Z794 Long term (current) use of insulin: Secondary | ICD-10-CM | POA: Diagnosis not present

## 2014-10-13 DIAGNOSIS — E1151 Type 2 diabetes mellitus with diabetic peripheral angiopathy without gangrene: Secondary | ICD-10-CM | POA: Diagnosis not present

## 2014-10-13 DIAGNOSIS — N183 Chronic kidney disease, stage 3 (moderate): Secondary | ICD-10-CM | POA: Diagnosis not present

## 2014-10-13 DIAGNOSIS — I129 Hypertensive chronic kidney disease with stage 1 through stage 4 chronic kidney disease, or unspecified chronic kidney disease: Secondary | ICD-10-CM | POA: Diagnosis not present

## 2014-10-13 DIAGNOSIS — E1122 Type 2 diabetes mellitus with diabetic chronic kidney disease: Secondary | ICD-10-CM | POA: Diagnosis not present

## 2014-10-13 DIAGNOSIS — Z23 Encounter for immunization: Secondary | ICD-10-CM | POA: Diagnosis not present

## 2014-10-13 DIAGNOSIS — Z79899 Other long term (current) drug therapy: Secondary | ICD-10-CM | POA: Diagnosis not present

## 2014-10-13 DIAGNOSIS — K5900 Constipation, unspecified: Secondary | ICD-10-CM | POA: Diagnosis not present

## 2014-10-19 ENCOUNTER — Encounter: Payer: Self-pay | Admitting: Podiatry

## 2014-10-19 ENCOUNTER — Ambulatory Visit (INDEPENDENT_AMBULATORY_CARE_PROVIDER_SITE_OTHER): Payer: Medicare Other | Admitting: Podiatry

## 2014-10-19 DIAGNOSIS — B351 Tinea unguium: Secondary | ICD-10-CM | POA: Diagnosis not present

## 2014-10-19 DIAGNOSIS — L84 Corns and callosities: Secondary | ICD-10-CM

## 2014-10-19 DIAGNOSIS — M79673 Pain in unspecified foot: Secondary | ICD-10-CM | POA: Diagnosis not present

## 2014-10-19 NOTE — Progress Notes (Signed)
Patient ID: Valerie Garrison, female   DOB: 12-May-1916, 81 y.o.   MRN: [id number] HPI  Complaint:  Visit Type: Patient returns to my office for continued preventative foot care services. Complaint: Patient states" my nails have grown long and thick and become painful to walk and wear shoes" Patient has been diagnosed with DM with no complications. He presents for preventative foot care services. No changes to ROS.  She also has painful corns third toes both feet. Podiatric Exam: Vascular: dorsalis pedis and posterior tibial pulses are negative. Capillary return is immediate. Temperature gradient is negative. Skin turgor WNL, bilateral swelling  Sensorium: Normal Semmes Weinstein monofilament test. Normal tactile sensation bilaterally.  Nail Exam: Pt has thick disfigured discolored nails with subungual debris noted bilateral entire nail hallux through fifth toenails Ulcer Exam: There is no evidence of ulcer or pre-ulcerative changes or infection. Orthopedic Exam: Muscle tone and strength are WNL. No limitations in general ROM. No crepitus or effusions noted. Foot type and digits show no abnormalities. Bony prominences are unremarkable. Severe HAV B/L Skin: No Porokeratosis. No infection or ulcers.  Distal clavi 3rd toes both feet. Hyperkeratosis right heel.  Diagnosis:  Tinea unguium, Pain in right toe, pain in left toes  Treatment & Plan Procedures and Treatment: Consent by patient was obtained for treatment procedures. The patient understood the discussion of treatment and procedures well. All questions were answered thoroughly reviewed. Debridement of mycotic and hypertrophic toenails, 1 through 5 bilateral and clearing of subungual debris. No ulceration, no infection noted.  Return Visit-Office Procedure: Patient instructed to return to the office for a follow up visit 3 months for continued evaluation and treatment.

## 2014-11-04 DIAGNOSIS — I129 Hypertensive chronic kidney disease with stage 1 through stage 4 chronic kidney disease, or unspecified chronic kidney disease: Secondary | ICD-10-CM | POA: Diagnosis not present

## 2014-11-04 DIAGNOSIS — R269 Unspecified abnormalities of gait and mobility: Secondary | ICD-10-CM | POA: Diagnosis not present

## 2014-11-04 DIAGNOSIS — Z794 Long term (current) use of insulin: Secondary | ICD-10-CM | POA: Diagnosis not present

## 2014-11-04 DIAGNOSIS — E1151 Type 2 diabetes mellitus with diabetic peripheral angiopathy without gangrene: Secondary | ICD-10-CM | POA: Diagnosis not present

## 2014-11-04 DIAGNOSIS — E1122 Type 2 diabetes mellitus with diabetic chronic kidney disease: Secondary | ICD-10-CM | POA: Diagnosis not present

## 2014-11-04 DIAGNOSIS — Z1389 Encounter for screening for other disorder: Secondary | ICD-10-CM | POA: Diagnosis not present

## 2014-11-04 DIAGNOSIS — Z0000 Encounter for general adult medical examination without abnormal findings: Secondary | ICD-10-CM | POA: Diagnosis not present

## 2014-11-04 DIAGNOSIS — Z79899 Other long term (current) drug therapy: Secondary | ICD-10-CM | POA: Diagnosis not present

## 2014-11-04 DIAGNOSIS — N183 Chronic kidney disease, stage 3 (moderate): Secondary | ICD-10-CM | POA: Diagnosis not present

## 2014-11-14 DIAGNOSIS — R269 Unspecified abnormalities of gait and mobility: Secondary | ICD-10-CM | POA: Diagnosis not present

## 2014-11-14 DIAGNOSIS — E1122 Type 2 diabetes mellitus with diabetic chronic kidney disease: Secondary | ICD-10-CM | POA: Diagnosis not present

## 2014-11-14 DIAGNOSIS — I739 Peripheral vascular disease, unspecified: Secondary | ICD-10-CM | POA: Diagnosis not present

## 2014-11-14 DIAGNOSIS — I129 Hypertensive chronic kidney disease with stage 1 through stage 4 chronic kidney disease, or unspecified chronic kidney disease: Secondary | ICD-10-CM | POA: Diagnosis not present

## 2014-11-14 DIAGNOSIS — N183 Chronic kidney disease, stage 3 (moderate): Secondary | ICD-10-CM | POA: Diagnosis not present

## 2014-11-14 DIAGNOSIS — Z9181 History of falling: Secondary | ICD-10-CM | POA: Diagnosis not present

## 2014-11-14 DIAGNOSIS — Z7984 Long term (current) use of oral hypoglycemic drugs: Secondary | ICD-10-CM | POA: Diagnosis not present

## 2014-11-17 DIAGNOSIS — R269 Unspecified abnormalities of gait and mobility: Secondary | ICD-10-CM | POA: Diagnosis not present

## 2014-11-17 DIAGNOSIS — Z9181 History of falling: Secondary | ICD-10-CM | POA: Diagnosis not present

## 2014-11-17 DIAGNOSIS — N183 Chronic kidney disease, stage 3 (moderate): Secondary | ICD-10-CM | POA: Diagnosis not present

## 2014-11-17 DIAGNOSIS — I129 Hypertensive chronic kidney disease with stage 1 through stage 4 chronic kidney disease, or unspecified chronic kidney disease: Secondary | ICD-10-CM | POA: Diagnosis not present

## 2014-11-17 DIAGNOSIS — E1122 Type 2 diabetes mellitus with diabetic chronic kidney disease: Secondary | ICD-10-CM | POA: Diagnosis not present

## 2014-11-17 DIAGNOSIS — I739 Peripheral vascular disease, unspecified: Secondary | ICD-10-CM | POA: Diagnosis not present

## 2014-11-19 DIAGNOSIS — N183 Chronic kidney disease, stage 3 (moderate): Secondary | ICD-10-CM | POA: Diagnosis not present

## 2014-11-19 DIAGNOSIS — E1122 Type 2 diabetes mellitus with diabetic chronic kidney disease: Secondary | ICD-10-CM | POA: Diagnosis not present

## 2014-11-19 DIAGNOSIS — R269 Unspecified abnormalities of gait and mobility: Secondary | ICD-10-CM | POA: Diagnosis not present

## 2014-11-19 DIAGNOSIS — Z9181 History of falling: Secondary | ICD-10-CM | POA: Diagnosis not present

## 2014-11-19 DIAGNOSIS — I129 Hypertensive chronic kidney disease with stage 1 through stage 4 chronic kidney disease, or unspecified chronic kidney disease: Secondary | ICD-10-CM | POA: Diagnosis not present

## 2014-11-19 DIAGNOSIS — I739 Peripheral vascular disease, unspecified: Secondary | ICD-10-CM | POA: Diagnosis not present

## 2014-11-23 DIAGNOSIS — R269 Unspecified abnormalities of gait and mobility: Secondary | ICD-10-CM | POA: Diagnosis not present

## 2014-11-23 DIAGNOSIS — E1122 Type 2 diabetes mellitus with diabetic chronic kidney disease: Secondary | ICD-10-CM | POA: Diagnosis not present

## 2014-11-23 DIAGNOSIS — Z9181 History of falling: Secondary | ICD-10-CM | POA: Diagnosis not present

## 2014-11-23 DIAGNOSIS — N183 Chronic kidney disease, stage 3 (moderate): Secondary | ICD-10-CM | POA: Diagnosis not present

## 2014-11-23 DIAGNOSIS — I129 Hypertensive chronic kidney disease with stage 1 through stage 4 chronic kidney disease, or unspecified chronic kidney disease: Secondary | ICD-10-CM | POA: Diagnosis not present

## 2014-11-23 DIAGNOSIS — I739 Peripheral vascular disease, unspecified: Secondary | ICD-10-CM | POA: Diagnosis not present

## 2014-11-26 DIAGNOSIS — I739 Peripheral vascular disease, unspecified: Secondary | ICD-10-CM | POA: Diagnosis not present

## 2014-11-26 DIAGNOSIS — R269 Unspecified abnormalities of gait and mobility: Secondary | ICD-10-CM | POA: Diagnosis not present

## 2014-11-26 DIAGNOSIS — Z9181 History of falling: Secondary | ICD-10-CM | POA: Diagnosis not present

## 2014-11-26 DIAGNOSIS — I129 Hypertensive chronic kidney disease with stage 1 through stage 4 chronic kidney disease, or unspecified chronic kidney disease: Secondary | ICD-10-CM | POA: Diagnosis not present

## 2014-11-26 DIAGNOSIS — E1122 Type 2 diabetes mellitus with diabetic chronic kidney disease: Secondary | ICD-10-CM | POA: Diagnosis not present

## 2014-11-26 DIAGNOSIS — N183 Chronic kidney disease, stage 3 (moderate): Secondary | ICD-10-CM | POA: Diagnosis not present

## 2014-12-01 DIAGNOSIS — Z9181 History of falling: Secondary | ICD-10-CM | POA: Diagnosis not present

## 2014-12-01 DIAGNOSIS — R269 Unspecified abnormalities of gait and mobility: Secondary | ICD-10-CM | POA: Diagnosis not present

## 2014-12-01 DIAGNOSIS — E1122 Type 2 diabetes mellitus with diabetic chronic kidney disease: Secondary | ICD-10-CM | POA: Diagnosis not present

## 2014-12-01 DIAGNOSIS — I739 Peripheral vascular disease, unspecified: Secondary | ICD-10-CM | POA: Diagnosis not present

## 2014-12-01 DIAGNOSIS — I129 Hypertensive chronic kidney disease with stage 1 through stage 4 chronic kidney disease, or unspecified chronic kidney disease: Secondary | ICD-10-CM | POA: Diagnosis not present

## 2014-12-01 DIAGNOSIS — N183 Chronic kidney disease, stage 3 (moderate): Secondary | ICD-10-CM | POA: Diagnosis not present

## 2014-12-03 DIAGNOSIS — I739 Peripheral vascular disease, unspecified: Secondary | ICD-10-CM | POA: Diagnosis not present

## 2014-12-03 DIAGNOSIS — R269 Unspecified abnormalities of gait and mobility: Secondary | ICD-10-CM | POA: Diagnosis not present

## 2014-12-03 DIAGNOSIS — I129 Hypertensive chronic kidney disease with stage 1 through stage 4 chronic kidney disease, or unspecified chronic kidney disease: Secondary | ICD-10-CM | POA: Diagnosis not present

## 2014-12-03 DIAGNOSIS — E1122 Type 2 diabetes mellitus with diabetic chronic kidney disease: Secondary | ICD-10-CM | POA: Diagnosis not present

## 2014-12-03 DIAGNOSIS — N183 Chronic kidney disease, stage 3 (moderate): Secondary | ICD-10-CM | POA: Diagnosis not present

## 2014-12-03 DIAGNOSIS — Z9181 History of falling: Secondary | ICD-10-CM | POA: Diagnosis not present

## 2014-12-07 DIAGNOSIS — E1122 Type 2 diabetes mellitus with diabetic chronic kidney disease: Secondary | ICD-10-CM | POA: Diagnosis not present

## 2014-12-07 DIAGNOSIS — N183 Chronic kidney disease, stage 3 (moderate): Secondary | ICD-10-CM | POA: Diagnosis not present

## 2014-12-07 DIAGNOSIS — R269 Unspecified abnormalities of gait and mobility: Secondary | ICD-10-CM | POA: Diagnosis not present

## 2014-12-07 DIAGNOSIS — I129 Hypertensive chronic kidney disease with stage 1 through stage 4 chronic kidney disease, or unspecified chronic kidney disease: Secondary | ICD-10-CM | POA: Diagnosis not present

## 2014-12-07 DIAGNOSIS — I739 Peripheral vascular disease, unspecified: Secondary | ICD-10-CM | POA: Diagnosis not present

## 2014-12-07 DIAGNOSIS — Z9181 History of falling: Secondary | ICD-10-CM | POA: Diagnosis not present

## 2014-12-10 DIAGNOSIS — N183 Chronic kidney disease, stage 3 (moderate): Secondary | ICD-10-CM | POA: Diagnosis not present

## 2014-12-10 DIAGNOSIS — I129 Hypertensive chronic kidney disease with stage 1 through stage 4 chronic kidney disease, or unspecified chronic kidney disease: Secondary | ICD-10-CM | POA: Diagnosis not present

## 2014-12-10 DIAGNOSIS — Z9181 History of falling: Secondary | ICD-10-CM | POA: Diagnosis not present

## 2014-12-10 DIAGNOSIS — E1122 Type 2 diabetes mellitus with diabetic chronic kidney disease: Secondary | ICD-10-CM | POA: Diagnosis not present

## 2014-12-10 DIAGNOSIS — R269 Unspecified abnormalities of gait and mobility: Secondary | ICD-10-CM | POA: Diagnosis not present

## 2014-12-10 DIAGNOSIS — I739 Peripheral vascular disease, unspecified: Secondary | ICD-10-CM | POA: Diagnosis not present

## 2014-12-14 DIAGNOSIS — I739 Peripheral vascular disease, unspecified: Secondary | ICD-10-CM | POA: Diagnosis not present

## 2014-12-14 DIAGNOSIS — I129 Hypertensive chronic kidney disease with stage 1 through stage 4 chronic kidney disease, or unspecified chronic kidney disease: Secondary | ICD-10-CM | POA: Diagnosis not present

## 2014-12-14 DIAGNOSIS — N183 Chronic kidney disease, stage 3 (moderate): Secondary | ICD-10-CM | POA: Diagnosis not present

## 2014-12-14 DIAGNOSIS — R269 Unspecified abnormalities of gait and mobility: Secondary | ICD-10-CM | POA: Diagnosis not present

## 2014-12-14 DIAGNOSIS — Z9181 History of falling: Secondary | ICD-10-CM | POA: Diagnosis not present

## 2014-12-14 DIAGNOSIS — E1122 Type 2 diabetes mellitus with diabetic chronic kidney disease: Secondary | ICD-10-CM | POA: Diagnosis not present

## 2014-12-15 DIAGNOSIS — R269 Unspecified abnormalities of gait and mobility: Secondary | ICD-10-CM | POA: Diagnosis not present

## 2014-12-15 DIAGNOSIS — I129 Hypertensive chronic kidney disease with stage 1 through stage 4 chronic kidney disease, or unspecified chronic kidney disease: Secondary | ICD-10-CM | POA: Diagnosis not present

## 2014-12-15 DIAGNOSIS — I739 Peripheral vascular disease, unspecified: Secondary | ICD-10-CM | POA: Diagnosis not present

## 2014-12-15 DIAGNOSIS — Z9181 History of falling: Secondary | ICD-10-CM | POA: Diagnosis not present

## 2014-12-15 DIAGNOSIS — N183 Chronic kidney disease, stage 3 (moderate): Secondary | ICD-10-CM | POA: Diagnosis not present

## 2014-12-15 DIAGNOSIS — E1122 Type 2 diabetes mellitus with diabetic chronic kidney disease: Secondary | ICD-10-CM | POA: Diagnosis not present

## 2014-12-21 DIAGNOSIS — N183 Chronic kidney disease, stage 3 (moderate): Secondary | ICD-10-CM | POA: Diagnosis not present

## 2014-12-21 DIAGNOSIS — I129 Hypertensive chronic kidney disease with stage 1 through stage 4 chronic kidney disease, or unspecified chronic kidney disease: Secondary | ICD-10-CM | POA: Diagnosis not present

## 2014-12-22 DIAGNOSIS — E1122 Type 2 diabetes mellitus with diabetic chronic kidney disease: Secondary | ICD-10-CM | POA: Diagnosis not present

## 2014-12-22 DIAGNOSIS — N183 Chronic kidney disease, stage 3 (moderate): Secondary | ICD-10-CM | POA: Diagnosis not present

## 2014-12-22 DIAGNOSIS — R269 Unspecified abnormalities of gait and mobility: Secondary | ICD-10-CM | POA: Diagnosis not present

## 2014-12-22 DIAGNOSIS — Z9181 History of falling: Secondary | ICD-10-CM | POA: Diagnosis not present

## 2014-12-22 DIAGNOSIS — I129 Hypertensive chronic kidney disease with stage 1 through stage 4 chronic kidney disease, or unspecified chronic kidney disease: Secondary | ICD-10-CM | POA: Diagnosis not present

## 2014-12-22 DIAGNOSIS — I739 Peripheral vascular disease, unspecified: Secondary | ICD-10-CM | POA: Diagnosis not present

## 2014-12-24 DIAGNOSIS — I129 Hypertensive chronic kidney disease with stage 1 through stage 4 chronic kidney disease, or unspecified chronic kidney disease: Secondary | ICD-10-CM | POA: Diagnosis not present

## 2014-12-24 DIAGNOSIS — R269 Unspecified abnormalities of gait and mobility: Secondary | ICD-10-CM | POA: Diagnosis not present

## 2014-12-24 DIAGNOSIS — Z9181 History of falling: Secondary | ICD-10-CM | POA: Diagnosis not present

## 2014-12-24 DIAGNOSIS — E1122 Type 2 diabetes mellitus with diabetic chronic kidney disease: Secondary | ICD-10-CM | POA: Diagnosis not present

## 2014-12-24 DIAGNOSIS — I739 Peripheral vascular disease, unspecified: Secondary | ICD-10-CM | POA: Diagnosis not present

## 2014-12-24 DIAGNOSIS — N183 Chronic kidney disease, stage 3 (moderate): Secondary | ICD-10-CM | POA: Diagnosis not present

## 2014-12-28 DIAGNOSIS — R269 Unspecified abnormalities of gait and mobility: Secondary | ICD-10-CM | POA: Diagnosis not present

## 2014-12-28 DIAGNOSIS — I129 Hypertensive chronic kidney disease with stage 1 through stage 4 chronic kidney disease, or unspecified chronic kidney disease: Secondary | ICD-10-CM | POA: Diagnosis not present

## 2014-12-28 DIAGNOSIS — I739 Peripheral vascular disease, unspecified: Secondary | ICD-10-CM | POA: Diagnosis not present

## 2014-12-28 DIAGNOSIS — E1122 Type 2 diabetes mellitus with diabetic chronic kidney disease: Secondary | ICD-10-CM | POA: Diagnosis not present

## 2014-12-28 DIAGNOSIS — Z9181 History of falling: Secondary | ICD-10-CM | POA: Diagnosis not present

## 2014-12-28 DIAGNOSIS — N183 Chronic kidney disease, stage 3 (moderate): Secondary | ICD-10-CM | POA: Diagnosis not present

## 2014-12-31 DIAGNOSIS — E1122 Type 2 diabetes mellitus with diabetic chronic kidney disease: Secondary | ICD-10-CM | POA: Diagnosis not present

## 2014-12-31 DIAGNOSIS — Z9181 History of falling: Secondary | ICD-10-CM | POA: Diagnosis not present

## 2014-12-31 DIAGNOSIS — I739 Peripheral vascular disease, unspecified: Secondary | ICD-10-CM | POA: Diagnosis not present

## 2014-12-31 DIAGNOSIS — N183 Chronic kidney disease, stage 3 (moderate): Secondary | ICD-10-CM | POA: Diagnosis not present

## 2014-12-31 DIAGNOSIS — I129 Hypertensive chronic kidney disease with stage 1 through stage 4 chronic kidney disease, or unspecified chronic kidney disease: Secondary | ICD-10-CM | POA: Diagnosis not present

## 2014-12-31 DIAGNOSIS — R269 Unspecified abnormalities of gait and mobility: Secondary | ICD-10-CM | POA: Diagnosis not present

## 2015-01-04 DIAGNOSIS — R269 Unspecified abnormalities of gait and mobility: Secondary | ICD-10-CM | POA: Diagnosis not present

## 2015-01-04 DIAGNOSIS — I129 Hypertensive chronic kidney disease with stage 1 through stage 4 chronic kidney disease, or unspecified chronic kidney disease: Secondary | ICD-10-CM | POA: Diagnosis not present

## 2015-01-04 DIAGNOSIS — N183 Chronic kidney disease, stage 3 (moderate): Secondary | ICD-10-CM | POA: Diagnosis not present

## 2015-01-04 DIAGNOSIS — I739 Peripheral vascular disease, unspecified: Secondary | ICD-10-CM | POA: Diagnosis not present

## 2015-01-04 DIAGNOSIS — Z9181 History of falling: Secondary | ICD-10-CM | POA: Diagnosis not present

## 2015-01-04 DIAGNOSIS — E1122 Type 2 diabetes mellitus with diabetic chronic kidney disease: Secondary | ICD-10-CM | POA: Diagnosis not present

## 2015-01-07 DIAGNOSIS — Z9181 History of falling: Secondary | ICD-10-CM | POA: Diagnosis not present

## 2015-01-07 DIAGNOSIS — I129 Hypertensive chronic kidney disease with stage 1 through stage 4 chronic kidney disease, or unspecified chronic kidney disease: Secondary | ICD-10-CM | POA: Diagnosis not present

## 2015-01-07 DIAGNOSIS — R269 Unspecified abnormalities of gait and mobility: Secondary | ICD-10-CM | POA: Diagnosis not present

## 2015-01-07 DIAGNOSIS — N183 Chronic kidney disease, stage 3 (moderate): Secondary | ICD-10-CM | POA: Diagnosis not present

## 2015-01-07 DIAGNOSIS — E1122 Type 2 diabetes mellitus with diabetic chronic kidney disease: Secondary | ICD-10-CM | POA: Diagnosis not present

## 2015-01-07 DIAGNOSIS — I739 Peripheral vascular disease, unspecified: Secondary | ICD-10-CM | POA: Diagnosis not present

## 2015-01-11 DIAGNOSIS — E1122 Type 2 diabetes mellitus with diabetic chronic kidney disease: Secondary | ICD-10-CM | POA: Diagnosis not present

## 2015-01-11 DIAGNOSIS — R269 Unspecified abnormalities of gait and mobility: Secondary | ICD-10-CM | POA: Diagnosis not present

## 2015-01-11 DIAGNOSIS — I739 Peripheral vascular disease, unspecified: Secondary | ICD-10-CM | POA: Diagnosis not present

## 2015-01-11 DIAGNOSIS — I129 Hypertensive chronic kidney disease with stage 1 through stage 4 chronic kidney disease, or unspecified chronic kidney disease: Secondary | ICD-10-CM | POA: Diagnosis not present

## 2015-01-11 DIAGNOSIS — Z9181 History of falling: Secondary | ICD-10-CM | POA: Diagnosis not present

## 2015-01-11 DIAGNOSIS — N183 Chronic kidney disease, stage 3 (moderate): Secondary | ICD-10-CM | POA: Diagnosis not present

## 2015-01-13 DIAGNOSIS — E1122 Type 2 diabetes mellitus with diabetic chronic kidney disease: Secondary | ICD-10-CM | POA: Diagnosis not present

## 2015-01-13 DIAGNOSIS — Z9181 History of falling: Secondary | ICD-10-CM | POA: Diagnosis not present

## 2015-01-13 DIAGNOSIS — I129 Hypertensive chronic kidney disease with stage 1 through stage 4 chronic kidney disease, or unspecified chronic kidney disease: Secondary | ICD-10-CM | POA: Diagnosis not present

## 2015-01-13 DIAGNOSIS — R269 Unspecified abnormalities of gait and mobility: Secondary | ICD-10-CM | POA: Diagnosis not present

## 2015-01-13 DIAGNOSIS — I739 Peripheral vascular disease, unspecified: Secondary | ICD-10-CM | POA: Diagnosis not present

## 2015-01-13 DIAGNOSIS — Z7984 Long term (current) use of oral hypoglycemic drugs: Secondary | ICD-10-CM | POA: Diagnosis not present

## 2015-01-13 DIAGNOSIS — N183 Chronic kidney disease, stage 3 (moderate): Secondary | ICD-10-CM | POA: Diagnosis not present

## 2015-01-14 DIAGNOSIS — R269 Unspecified abnormalities of gait and mobility: Secondary | ICD-10-CM | POA: Diagnosis not present

## 2015-01-14 DIAGNOSIS — N183 Chronic kidney disease, stage 3 (moderate): Secondary | ICD-10-CM | POA: Diagnosis not present

## 2015-01-14 DIAGNOSIS — E1122 Type 2 diabetes mellitus with diabetic chronic kidney disease: Secondary | ICD-10-CM | POA: Diagnosis not present

## 2015-01-14 DIAGNOSIS — I129 Hypertensive chronic kidney disease with stage 1 through stage 4 chronic kidney disease, or unspecified chronic kidney disease: Secondary | ICD-10-CM | POA: Diagnosis not present

## 2015-01-14 DIAGNOSIS — Z9181 History of falling: Secondary | ICD-10-CM | POA: Diagnosis not present

## 2015-01-14 DIAGNOSIS — I739 Peripheral vascular disease, unspecified: Secondary | ICD-10-CM | POA: Diagnosis not present

## 2015-01-25 ENCOUNTER — Encounter: Payer: Self-pay | Admitting: Podiatry

## 2015-01-25 ENCOUNTER — Ambulatory Visit (INDEPENDENT_AMBULATORY_CARE_PROVIDER_SITE_OTHER): Payer: Medicare Other | Admitting: Podiatry

## 2015-01-25 DIAGNOSIS — B351 Tinea unguium: Secondary | ICD-10-CM | POA: Diagnosis not present

## 2015-01-25 DIAGNOSIS — L84 Corns and callosities: Secondary | ICD-10-CM

## 2015-01-25 DIAGNOSIS — M79673 Pain in unspecified foot: Secondary | ICD-10-CM

## 2015-01-25 NOTE — Progress Notes (Signed)
Patient ID: Valerie Garrison, female   DOB: 1916/05/14, 82 y.o.   MRN: [id number] HPI  Complaint:  Visit Type: Patient returns to my office for continued preventative foot care services. Complaint: Patient states" my nails have grown long and thick and become painful to walk and wear shoes" Patient has been diagnosed with DM with no complications. He presents for preventative foot care services. No changes to ROS.  She also has painful corns third toes both feet. Podiatric Exam: Vascular: dorsalis pedis and posterior tibial pulses are negative. Capillary return is immediate. Temperature gradient is negative. Skin turgor WNL, bilateral swelling  Sensorium: Normal Semmes Weinstein monofilament test. Normal tactile sensation bilaterally.  Nail Exam: Pt has thick disfigured discolored nails with subungual debris noted bilateral entire nail hallux through fifth toenails Ulcer Exam: There is no evidence of ulcer or pre-ulcerative changes or infection. Orthopedic Exam: Muscle tone and strength are WNL. No limitations in general ROM. No crepitus or effusions noted. Foot type and digits show no abnormalities. Bony prominences are unremarkable. Severe HAV B/L Skin: No Porokeratosis. No infection or ulcers.  Distal clavi 3rd toes both feet. Debride heloma durum fifth toe left foot.  Diagnosis:  Tinea unguium, Pain in right toe, pain in left toes,   corn B/L  Treatment & Plan Procedures and Treatment: Consent by patient was obtained for treatment procedures. The patient understood the discussion of treatment and procedures well. All questions were answered thoroughly reviewed. Debridement of mycotic and hypertrophic toenails, 1 through 5 bilateral and clearing of subungual debris. No ulceration, no infection noted. Debride corns. Return Visit-Office Procedure: Patient instructed to return to the office for a follow up visit 3 months for continued evaluation and treatment.   Gardiner Barefoot DPM

## 2015-01-26 DIAGNOSIS — I129 Hypertensive chronic kidney disease with stage 1 through stage 4 chronic kidney disease, or unspecified chronic kidney disease: Secondary | ICD-10-CM | POA: Diagnosis not present

## 2015-01-26 DIAGNOSIS — I739 Peripheral vascular disease, unspecified: Secondary | ICD-10-CM | POA: Diagnosis not present

## 2015-01-26 DIAGNOSIS — Z9181 History of falling: Secondary | ICD-10-CM | POA: Diagnosis not present

## 2015-01-26 DIAGNOSIS — R269 Unspecified abnormalities of gait and mobility: Secondary | ICD-10-CM | POA: Diagnosis not present

## 2015-01-26 DIAGNOSIS — N183 Chronic kidney disease, stage 3 (moderate): Secondary | ICD-10-CM | POA: Diagnosis not present

## 2015-01-26 DIAGNOSIS — E1122 Type 2 diabetes mellitus with diabetic chronic kidney disease: Secondary | ICD-10-CM | POA: Diagnosis not present

## 2015-01-28 DIAGNOSIS — E1122 Type 2 diabetes mellitus with diabetic chronic kidney disease: Secondary | ICD-10-CM | POA: Diagnosis not present

## 2015-01-28 DIAGNOSIS — N183 Chronic kidney disease, stage 3 (moderate): Secondary | ICD-10-CM | POA: Diagnosis not present

## 2015-01-28 DIAGNOSIS — Z9181 History of falling: Secondary | ICD-10-CM | POA: Diagnosis not present

## 2015-01-28 DIAGNOSIS — I739 Peripheral vascular disease, unspecified: Secondary | ICD-10-CM | POA: Diagnosis not present

## 2015-01-28 DIAGNOSIS — I129 Hypertensive chronic kidney disease with stage 1 through stage 4 chronic kidney disease, or unspecified chronic kidney disease: Secondary | ICD-10-CM | POA: Diagnosis not present

## 2015-01-28 DIAGNOSIS — R269 Unspecified abnormalities of gait and mobility: Secondary | ICD-10-CM | POA: Diagnosis not present

## 2015-02-01 DIAGNOSIS — I739 Peripheral vascular disease, unspecified: Secondary | ICD-10-CM | POA: Diagnosis not present

## 2015-02-01 DIAGNOSIS — R269 Unspecified abnormalities of gait and mobility: Secondary | ICD-10-CM | POA: Diagnosis not present

## 2015-02-01 DIAGNOSIS — N183 Chronic kidney disease, stage 3 (moderate): Secondary | ICD-10-CM | POA: Diagnosis not present

## 2015-02-01 DIAGNOSIS — I129 Hypertensive chronic kidney disease with stage 1 through stage 4 chronic kidney disease, or unspecified chronic kidney disease: Secondary | ICD-10-CM | POA: Diagnosis not present

## 2015-02-01 DIAGNOSIS — E1122 Type 2 diabetes mellitus with diabetic chronic kidney disease: Secondary | ICD-10-CM | POA: Diagnosis not present

## 2015-02-01 DIAGNOSIS — Z9181 History of falling: Secondary | ICD-10-CM | POA: Diagnosis not present

## 2015-02-04 DIAGNOSIS — I129 Hypertensive chronic kidney disease with stage 1 through stage 4 chronic kidney disease, or unspecified chronic kidney disease: Secondary | ICD-10-CM | POA: Diagnosis not present

## 2015-02-04 DIAGNOSIS — Z9181 History of falling: Secondary | ICD-10-CM | POA: Diagnosis not present

## 2015-02-04 DIAGNOSIS — E1122 Type 2 diabetes mellitus with diabetic chronic kidney disease: Secondary | ICD-10-CM | POA: Diagnosis not present

## 2015-02-04 DIAGNOSIS — R269 Unspecified abnormalities of gait and mobility: Secondary | ICD-10-CM | POA: Diagnosis not present

## 2015-02-04 DIAGNOSIS — I739 Peripheral vascular disease, unspecified: Secondary | ICD-10-CM | POA: Diagnosis not present

## 2015-02-04 DIAGNOSIS — N183 Chronic kidney disease, stage 3 (moderate): Secondary | ICD-10-CM | POA: Diagnosis not present

## 2015-02-08 DIAGNOSIS — Z9181 History of falling: Secondary | ICD-10-CM | POA: Diagnosis not present

## 2015-02-08 DIAGNOSIS — E1122 Type 2 diabetes mellitus with diabetic chronic kidney disease: Secondary | ICD-10-CM | POA: Diagnosis not present

## 2015-02-08 DIAGNOSIS — I129 Hypertensive chronic kidney disease with stage 1 through stage 4 chronic kidney disease, or unspecified chronic kidney disease: Secondary | ICD-10-CM | POA: Diagnosis not present

## 2015-02-08 DIAGNOSIS — I739 Peripheral vascular disease, unspecified: Secondary | ICD-10-CM | POA: Diagnosis not present

## 2015-02-08 DIAGNOSIS — N183 Chronic kidney disease, stage 3 (moderate): Secondary | ICD-10-CM | POA: Diagnosis not present

## 2015-02-08 DIAGNOSIS — R269 Unspecified abnormalities of gait and mobility: Secondary | ICD-10-CM | POA: Diagnosis not present

## 2015-02-11 DIAGNOSIS — R269 Unspecified abnormalities of gait and mobility: Secondary | ICD-10-CM | POA: Diagnosis not present

## 2015-02-11 DIAGNOSIS — I739 Peripheral vascular disease, unspecified: Secondary | ICD-10-CM | POA: Diagnosis not present

## 2015-02-11 DIAGNOSIS — I129 Hypertensive chronic kidney disease with stage 1 through stage 4 chronic kidney disease, or unspecified chronic kidney disease: Secondary | ICD-10-CM | POA: Diagnosis not present

## 2015-02-11 DIAGNOSIS — N183 Chronic kidney disease, stage 3 (moderate): Secondary | ICD-10-CM | POA: Diagnosis not present

## 2015-02-11 DIAGNOSIS — Z9181 History of falling: Secondary | ICD-10-CM | POA: Diagnosis not present

## 2015-02-11 DIAGNOSIS — E1122 Type 2 diabetes mellitus with diabetic chronic kidney disease: Secondary | ICD-10-CM | POA: Diagnosis not present

## 2015-03-18 DIAGNOSIS — Z794 Long term (current) use of insulin: Secondary | ICD-10-CM | POA: Diagnosis not present

## 2015-03-18 DIAGNOSIS — E1151 Type 2 diabetes mellitus with diabetic peripheral angiopathy without gangrene: Secondary | ICD-10-CM | POA: Diagnosis not present

## 2015-03-18 DIAGNOSIS — I129 Hypertensive chronic kidney disease with stage 1 through stage 4 chronic kidney disease, or unspecified chronic kidney disease: Secondary | ICD-10-CM | POA: Diagnosis not present

## 2015-03-18 DIAGNOSIS — D696 Thrombocytopenia, unspecified: Secondary | ICD-10-CM | POA: Diagnosis not present

## 2015-03-18 DIAGNOSIS — Z79899 Other long term (current) drug therapy: Secondary | ICD-10-CM | POA: Diagnosis not present

## 2015-03-18 DIAGNOSIS — R269 Unspecified abnormalities of gait and mobility: Secondary | ICD-10-CM | POA: Diagnosis not present

## 2015-03-18 DIAGNOSIS — E1122 Type 2 diabetes mellitus with diabetic chronic kidney disease: Secondary | ICD-10-CM | POA: Diagnosis not present

## 2015-03-18 DIAGNOSIS — N183 Chronic kidney disease, stage 3 (moderate): Secondary | ICD-10-CM | POA: Diagnosis not present

## 2015-05-03 ENCOUNTER — Ambulatory Visit (INDEPENDENT_AMBULATORY_CARE_PROVIDER_SITE_OTHER): Payer: Medicare Other | Admitting: Podiatry

## 2015-05-03 ENCOUNTER — Encounter: Payer: Self-pay | Admitting: Podiatry

## 2015-05-03 DIAGNOSIS — B351 Tinea unguium: Secondary | ICD-10-CM

## 2015-05-03 DIAGNOSIS — L84 Corns and callosities: Secondary | ICD-10-CM

## 2015-05-03 DIAGNOSIS — M79673 Pain in unspecified foot: Secondary | ICD-10-CM

## 2015-05-03 DIAGNOSIS — E1151 Type 2 diabetes mellitus with diabetic peripheral angiopathy without gangrene: Secondary | ICD-10-CM | POA: Diagnosis not present

## 2015-05-03 NOTE — Progress Notes (Signed)
Patient ID: Valerie Garrison, female   DOB: 12/06/1916, 82 y.o.   MRN: [id number] HPI  Complaint:  Visit Type: Patient returns to my office for continued preventative foot care services. Complaint: Patient states" my nails have grown long and thick and become painful to walk and wear shoes" Patient has been diagnosed with DM with no complications. He presents for preventative foot care services. No changes to ROS.  She also has painful corns third toes both feet. Podiatric Exam: Vascular: dorsalis pedis and posterior tibial pulses are negative. Capillary return is immediate. Temperature gradient is negative. Skin turgor WNL, bilateral swelling  Sensorium: Normal Semmes Weinstein monofilament test. Normal tactile sensation bilaterally.  Nail Exam: Pt has thick disfigured discolored nails with subungual debris noted bilateral entire nail hallux through fifth toenails Ulcer Exam: There is no evidence of ulcer or pre-ulcerative changes or infection. Orthopedic Exam: Muscle tone and strength are WNL. No limitations in general ROM. No crepitus or effusions noted. Foot type and digits show no abnormalities. Bony prominences are unremarkable. Severe HAV B/L Skin: No Porokeratosis. No infection or ulcers.  Distal clavi 3rd toe right foot..  heloma durum fifth toe left foot.  Diagnosis:  Tinea unguium, Pain in right toe, pain in left toes,   corn B/L  Treatment & Plan Procedures and Treatment: Consent by patient was obtained for treatment procedures. The patient understood the discussion of treatment and procedures well. All questions were answered thoroughly reviewed. Debridement of mycotic and hypertrophic toenails, 1 through 5 bilateral and clearing of subungual debris. No ulceration, no infection noted. Debride corns. Drainage under heloma durum 5th toe left foot.  Neosporin/DSD  Soak left foot at home. Return Visit-Office Procedure: Patient instructed to return to the office for a follow up visit 10 weeks.  for continued evaluation and treatment.   Gardiner Barefoot DPM

## 2015-06-03 ENCOUNTER — Telehealth: Payer: Self-pay | Admitting: *Deleted

## 2015-06-03 NOTE — Telephone Encounter (Signed)
Pt's granddtr, Valerie Garrison states Dr. Prudence Davidson told them to soak her feet, but every store soak says to consult with a doctor because she is diabetic.  I told Valerie Garrison to just use warm water and gently massage the feet for comfort and dress the area with Neosporin and a light dressing and call with concerns.

## 2015-07-18 ENCOUNTER — Telehealth: Payer: Self-pay | Admitting: *Deleted

## 2015-07-18 NOTE — Telephone Encounter (Signed)
Pt's granddtr, Darrel Reach states pt has a severely painful corn and at last visit the doctor was not able to trim due to the pain.  Sharyn Lull asked if the area could be numbed prior to trimming.  I told Sharyn Lull I would put a note in to Dr. Prudence Davidson and on the visit schedule requesting the numbing agent.

## 2015-07-19 ENCOUNTER — Ambulatory Visit (INDEPENDENT_AMBULATORY_CARE_PROVIDER_SITE_OTHER): Payer: Medicare Other | Admitting: Podiatry

## 2015-07-19 ENCOUNTER — Encounter: Payer: Self-pay | Admitting: Podiatry

## 2015-07-19 DIAGNOSIS — L84 Corns and callosities: Secondary | ICD-10-CM | POA: Diagnosis not present

## 2015-07-19 DIAGNOSIS — B351 Tinea unguium: Secondary | ICD-10-CM | POA: Diagnosis not present

## 2015-07-19 DIAGNOSIS — M79673 Pain in unspecified foot: Secondary | ICD-10-CM | POA: Diagnosis not present

## 2015-07-19 NOTE — Progress Notes (Signed)
Patient ID: Valerie Garrison, female   DOB: [birth year], 82 y.o.   MRN: [id number] HPI  Complaint:  Visit Type: Patient returns to my office for continued preventative foot care services. Complaint: Patient states" my nails have grown long and thick and become painful to walk and wear shoes" Patient has been diagnosed with DM with no complications. He presents for preventative foot care services. No changes to ROS.  She also has painful corns third toes both feet. Podiatric Exam: Vascular: dorsalis pedis and posterior tibial pulses are negative. Capillary return is immediate. Temperature gradient is negative. Skin turgor WNL, bilateral swelling  Sensorium: Normal Semmes Weinstein monofilament test. Normal tactile sensation bilaterally.  Nail Exam: Pt has thick disfigured discolored nails with subungual debris noted bilateral entire nail hallux through fifth toenails Ulcer Exam: There is no evidence of ulcer or pre-ulcerative changes or infection. Orthopedic Exam: Muscle tone and strength are WNL. No limitations in general ROM. No crepitus or effusions noted. Foot type and digits show no abnormalities. Bony prominences are unremarkable. Severe HAV B/L Skin: No Porokeratosis. No infection or ulcers.  Distal clavi 3rd toes both feet. Debride heloma durum fifth toe left foot.  Diagnosis:  Tinea unguium, Pain in right toe, pain in left toes,   corn B/L  Treatment & Plan Procedures and Treatment: Consent by patient was obtained for treatment procedures. The patient understood the discussion of treatment and procedures well. All questions were answered thoroughly reviewed. Debridement of mycotic and hypertrophic toenails, 1 through 5 bilateral and clearing of subungual debris. No ulceration, no infection noted. Debride corns. Debride corn fifth toe under local anesthesia. Return Visit-Office Procedure: Patient instructed to return to the office for a follow up visit 3 months for continued evaluation and  treatment.   Gardiner Barefoot DPM

## 2015-07-22 DIAGNOSIS — N183 Chronic kidney disease, stage 3 (moderate): Secondary | ICD-10-CM | POA: Diagnosis not present

## 2015-07-22 DIAGNOSIS — I129 Hypertensive chronic kidney disease with stage 1 through stage 4 chronic kidney disease, or unspecified chronic kidney disease: Secondary | ICD-10-CM | POA: Diagnosis not present

## 2015-07-22 DIAGNOSIS — E1151 Type 2 diabetes mellitus with diabetic peripheral angiopathy without gangrene: Secondary | ICD-10-CM | POA: Diagnosis not present

## 2015-07-22 DIAGNOSIS — Z794 Long term (current) use of insulin: Secondary | ICD-10-CM | POA: Diagnosis not present

## 2015-09-27 ENCOUNTER — Ambulatory Visit: Payer: Medicare Other | Admitting: Podiatry

## 2015-10-11 ENCOUNTER — Ambulatory Visit (INDEPENDENT_AMBULATORY_CARE_PROVIDER_SITE_OTHER): Payer: Medicare Other | Admitting: Podiatry

## 2015-10-11 ENCOUNTER — Encounter: Payer: Self-pay | Admitting: Podiatry

## 2015-10-11 DIAGNOSIS — M79676 Pain in unspecified toe(s): Secondary | ICD-10-CM | POA: Diagnosis not present

## 2015-10-11 DIAGNOSIS — L84 Corns and callosities: Secondary | ICD-10-CM | POA: Diagnosis not present

## 2015-10-11 DIAGNOSIS — B351 Tinea unguium: Secondary | ICD-10-CM

## 2015-10-11 DIAGNOSIS — E1151 Type 2 diabetes mellitus with diabetic peripheral angiopathy without gangrene: Secondary | ICD-10-CM | POA: Diagnosis not present

## 2015-10-11 NOTE — Progress Notes (Signed)
Patient ID: Valerie Garrison, female   DOB: 09/24/1916, 82 y.o.   MRN: [id number] HPI  Complaint:  Visit Type: Patient returns to my office for continued preventative foot care services. Complaint: Patient states" my nails have grown long and thick and become painful to walk and wear shoes" Patient has been diagnosed with DM with no complications. He presents for preventative foot care services. No changes to ROS.  She also has painful corns third toes right and fifth left foot. Podiatric Exam: Vascular: dorsalis pedis and posterior tibial pulses are negative. Capillary return is immediate. Temperature gradient is negative. Skin turgor WNL, bilateral swelling  Sensorium: Normal Semmes Weinstein monofilament test. Normal tactile sensation bilaterally.  Nail Exam: Pt has thick disfigured discolored nails with subungual debris noted bilateral entire nail hallux through fifth toenails Ulcer Exam: There is no evidence of ulcer or pre-ulcerative changes or infection. Orthopedic Exam: Muscle tone and strength are WNL. No limitations in general ROM. No crepitus or effusions noted. Foot type and digits show no abnormalities. Bony prominences are unremarkable. Severe HAV B/L Skin: No Porokeratosis. No infection or ulcers.  Distal clavi 3rd toes both feet. Debride heloma durum fifth toe left foot.  Diagnosis:  Tinea unguium, Pain in right toe, pain in left toes,   corn B/L  Treatment & Plan Procedures and Treatment: Consent by patient was obtained for treatment procedures. The patient understood the discussion of treatment and procedures well. All questions were answered thoroughly reviewed. Debridement of mycotic and hypertrophic toenails, 1 through 5 bilateral and clearing of subungual debris. No ulceration, no infection noted. Debride corns. Debride corn fifth toe under local anesthesia. Return Visit-Office Procedure: Patient instructed to return to the office for a follow up visit 10 weeks  for continued  evaluation and treatment.   Gardiner Barefoot DPM

## 2015-11-03 DIAGNOSIS — H02403 Unspecified ptosis of bilateral eyelids: Secondary | ICD-10-CM | POA: Diagnosis not present

## 2015-11-03 DIAGNOSIS — E119 Type 2 diabetes mellitus without complications: Secondary | ICD-10-CM | POA: Diagnosis not present

## 2015-11-03 DIAGNOSIS — H2513 Age-related nuclear cataract, bilateral: Secondary | ICD-10-CM | POA: Diagnosis not present

## 2015-11-03 DIAGNOSIS — H04123 Dry eye syndrome of bilateral lacrimal glands: Secondary | ICD-10-CM | POA: Diagnosis not present

## 2015-11-14 DIAGNOSIS — I10 Essential (primary) hypertension: Secondary | ICD-10-CM | POA: Diagnosis not present

## 2015-11-14 DIAGNOSIS — R079 Chest pain, unspecified: Secondary | ICD-10-CM | POA: Diagnosis not present

## 2015-11-14 DIAGNOSIS — Z79899 Other long term (current) drug therapy: Secondary | ICD-10-CM | POA: Diagnosis not present

## 2015-11-14 DIAGNOSIS — E119 Type 2 diabetes mellitus without complications: Secondary | ICD-10-CM | POA: Diagnosis not present

## 2015-11-14 DIAGNOSIS — I129 Hypertensive chronic kidney disease with stage 1 through stage 4 chronic kidney disease, or unspecified chronic kidney disease: Secondary | ICD-10-CM | POA: Diagnosis not present

## 2015-11-14 DIAGNOSIS — N184 Chronic kidney disease, stage 4 (severe): Secondary | ICD-10-CM | POA: Diagnosis not present

## 2015-11-14 DIAGNOSIS — Z7984 Long term (current) use of oral hypoglycemic drugs: Secondary | ICD-10-CM | POA: Diagnosis not present

## 2015-11-14 DIAGNOSIS — E1122 Type 2 diabetes mellitus with diabetic chronic kidney disease: Secondary | ICD-10-CM | POA: Diagnosis not present

## 2015-11-14 DIAGNOSIS — F419 Anxiety disorder, unspecified: Secondary | ICD-10-CM | POA: Diagnosis not present

## 2015-11-24 DIAGNOSIS — Z7984 Long term (current) use of oral hypoglycemic drugs: Secondary | ICD-10-CM | POA: Diagnosis not present

## 2015-11-24 DIAGNOSIS — N183 Chronic kidney disease, stage 3 (moderate): Secondary | ICD-10-CM | POA: Diagnosis not present

## 2015-11-24 DIAGNOSIS — E1122 Type 2 diabetes mellitus with diabetic chronic kidney disease: Secondary | ICD-10-CM | POA: Diagnosis not present

## 2015-11-24 DIAGNOSIS — I129 Hypertensive chronic kidney disease with stage 1 through stage 4 chronic kidney disease, or unspecified chronic kidney disease: Secondary | ICD-10-CM | POA: Diagnosis not present

## 2015-11-24 DIAGNOSIS — E1151 Type 2 diabetes mellitus with diabetic peripheral angiopathy without gangrene: Secondary | ICD-10-CM | POA: Diagnosis not present

## 2015-11-24 DIAGNOSIS — Z1389 Encounter for screening for other disorder: Secondary | ICD-10-CM | POA: Diagnosis not present

## 2015-11-24 DIAGNOSIS — Z7189 Other specified counseling: Secondary | ICD-10-CM | POA: Diagnosis not present

## 2015-11-24 DIAGNOSIS — Z23 Encounter for immunization: Secondary | ICD-10-CM | POA: Diagnosis not present

## 2015-11-24 DIAGNOSIS — D696 Thrombocytopenia, unspecified: Secondary | ICD-10-CM | POA: Diagnosis not present

## 2015-11-24 DIAGNOSIS — Z0000 Encounter for general adult medical examination without abnormal findings: Secondary | ICD-10-CM | POA: Diagnosis not present

## 2015-12-20 ENCOUNTER — Ambulatory Visit (INDEPENDENT_AMBULATORY_CARE_PROVIDER_SITE_OTHER): Payer: Medicare Other | Admitting: Podiatry

## 2015-12-20 ENCOUNTER — Encounter: Payer: Self-pay | Admitting: Podiatry

## 2015-12-20 DIAGNOSIS — L84 Corns and callosities: Secondary | ICD-10-CM

## 2015-12-20 DIAGNOSIS — B351 Tinea unguium: Secondary | ICD-10-CM

## 2015-12-20 NOTE — Addendum Note (Signed)
Addended byDeidre Ala, Domanick Cuccia L on: 12/20/2015 04:32 PM   Modules accepted: Orders

## 2015-12-20 NOTE — Progress Notes (Signed)
Patient ID: Valerie Garrison, female   DOB: 1916/08/04, 82 y.o.   MRN: [id number] HPI  Complaint:  Visit Type: Patient returns to my office for continued preventative foot care services. Complaint: Patient states" my nails have grown long and thick and become painful to walk and wear shoes" Patient has been diagnosed with DM with no complications. He presents for preventative foot care services. No changes to ROS.  She also has painful corns third toes right and fifth left foot. Podiatric Exam: Vascular: dorsalis pedis and posterior tibial pulses are negative. Capillary return is immediate. Temperature gradient is negative. Skin turgor WNL, bilateral swelling  Sensorium: Normal Semmes Weinstein monofilament test. Normal tactile sensation bilaterally.  Nail Exam: Pt has thick disfigured discolored nails with subungual debris noted bilateral entire nail hallux through fifth toenails Ulcer Exam: There is no evidence of ulcer or pre-ulcerative changes or infection. Orthopedic Exam: Muscle tone and strength are WNL. No limitations in general ROM. No crepitus or effusions noted. Foot type and digits show no abnormalities. Bony prominences are unremarkable. Severe HAV B/L Skin: No Porokeratosis. No infection or ulcers.  Distal clavi 3rd toes both feet. Debride heloma durum fifth toe left foot.  Diagnosis:  Tinea unguium, Pain in right toe, pain in left toes,   corn B/L  Treatment & Plan Procedures and Treatment: Consent by patient was obtained for treatment procedures. The patient understood the discussion of treatment and procedures well. All questions were answered thoroughly reviewed. Debridement of mycotic and hypertrophic toenails, 1 through 5 bilateral and clearing of subungual debris. No ulceration, no infection noted. Debride corns. Debride corn fifth toe under local anesthesia. Return Visit-Office Procedure: Patient instructed to return to the office for a follow up visit 10 weeks  for continued  evaluation and treatment.   Gardiner Barefoot DPM

## 2016-01-12 DIAGNOSIS — H40003 Preglaucoma, unspecified, bilateral: Secondary | ICD-10-CM | POA: Diagnosis not present

## 2016-01-12 DIAGNOSIS — H527 Unspecified disorder of refraction: Secondary | ICD-10-CM | POA: Diagnosis not present

## 2016-01-12 DIAGNOSIS — H25813 Combined forms of age-related cataract, bilateral: Secondary | ICD-10-CM | POA: Diagnosis not present

## 2016-01-12 DIAGNOSIS — E119 Type 2 diabetes mellitus without complications: Secondary | ICD-10-CM | POA: Diagnosis not present

## 2016-01-19 DIAGNOSIS — I499 Cardiac arrhythmia, unspecified: Secondary | ICD-10-CM | POA: Diagnosis not present

## 2016-01-19 DIAGNOSIS — F419 Anxiety disorder, unspecified: Secondary | ICD-10-CM | POA: Diagnosis not present

## 2016-01-19 DIAGNOSIS — I129 Hypertensive chronic kidney disease with stage 1 through stage 4 chronic kidney disease, or unspecified chronic kidney disease: Secondary | ICD-10-CM | POA: Diagnosis not present

## 2016-01-19 DIAGNOSIS — R531 Weakness: Secondary | ICD-10-CM | POA: Diagnosis not present

## 2016-01-19 DIAGNOSIS — Z7984 Long term (current) use of oral hypoglycemic drugs: Secondary | ICD-10-CM | POA: Diagnosis not present

## 2016-01-19 DIAGNOSIS — N184 Chronic kidney disease, stage 4 (severe): Secondary | ICD-10-CM | POA: Diagnosis not present

## 2016-01-19 DIAGNOSIS — E1122 Type 2 diabetes mellitus with diabetic chronic kidney disease: Secondary | ICD-10-CM | POA: Diagnosis not present

## 2016-01-19 DIAGNOSIS — R001 Bradycardia, unspecified: Secondary | ICD-10-CM | POA: Diagnosis not present

## 2016-01-19 DIAGNOSIS — Z79899 Other long term (current) drug therapy: Secondary | ICD-10-CM | POA: Diagnosis not present

## 2016-01-19 DIAGNOSIS — E1136 Type 2 diabetes mellitus with diabetic cataract: Secondary | ICD-10-CM | POA: Diagnosis not present

## 2016-01-19 DIAGNOSIS — R55 Syncope and collapse: Secondary | ICD-10-CM | POA: Diagnosis not present

## 2016-01-19 DIAGNOSIS — R197 Diarrhea, unspecified: Secondary | ICD-10-CM | POA: Diagnosis not present

## 2016-01-20 DIAGNOSIS — R531 Weakness: Secondary | ICD-10-CM | POA: Diagnosis not present

## 2016-01-30 DIAGNOSIS — R197 Diarrhea, unspecified: Secondary | ICD-10-CM | POA: Diagnosis not present

## 2016-01-30 DIAGNOSIS — R55 Syncope and collapse: Secondary | ICD-10-CM | POA: Diagnosis not present

## 2016-03-01 ENCOUNTER — Ambulatory Visit (INDEPENDENT_AMBULATORY_CARE_PROVIDER_SITE_OTHER): Payer: Medicare Other | Admitting: Podiatry

## 2016-03-01 DIAGNOSIS — B351 Tinea unguium: Secondary | ICD-10-CM | POA: Diagnosis not present

## 2016-03-01 DIAGNOSIS — E1151 Type 2 diabetes mellitus with diabetic peripheral angiopathy without gangrene: Secondary | ICD-10-CM

## 2016-03-01 DIAGNOSIS — L84 Corns and callosities: Secondary | ICD-10-CM

## 2016-03-01 NOTE — Progress Notes (Signed)
Patient ID: Valerie Garrison, female   DOB: 1916-10-08, 83 y.o.   MRN: [id number] HPI  Complaint:  Visit Type: Patient returns to my office for continued preventative foot care services. Complaint: Patient states" my nails have grown long and thick and become painful to walk and wear shoes" Patient has been diagnosed with DM with no complications. He presents for preventative foot care services. No changes to ROS.  She also has painful corns third toes both feet. Podiatric Exam: Vascular: dorsalis pedis and posterior tibial pulses are negative. Capillary return is immediate. Temperature gradient is negative. Skin turgor WNL, bilateral swelling  Sensorium: Normal Semmes Weinstein monofilament test. Normal tactile sensation bilaterally.  Nail Exam: Pt has thick disfigured discolored nails with subungual debris noted bilateral entire nail hallux through fifth toenails Ulcer Exam: There is no evidence of ulcer or pre-ulcerative changes or infection. Orthopedic Exam: Muscle tone and strength are WNL. No limitations in general ROM. No crepitus or effusions noted. Foot type and digits show no abnormalities. Bony prominences are unremarkable. Severe HAV B/L Skin: No Porokeratosis. No infection or ulcers.  Distal clavi 3rd toes both feet. Debride heloma durum fifth toe left foot.  Diagnosis:  Tinea unguium, Pain in right toe, pain in left toes,   corn B/L  Treatment & Plan Procedures and Treatment: Consent by patient was obtained for treatment procedures. The patient understood the discussion of treatment and procedures well. All questions were answered thoroughly reviewed. Debridement of mycotic and hypertrophic toenails, 1 through 5 bilateral and clearing of subungual debris. No ulceration, no infection noted. Debride corns. Debride corn fifth toe under local anesthesia. Return Visit-Office Procedure: Patient instructed to return to the office for a follow up visit 3 months for continued evaluation and  treatment.   Gardiner Barefoot DPM

## 2016-05-03 DIAGNOSIS — I129 Hypertensive chronic kidney disease with stage 1 through stage 4 chronic kidney disease, or unspecified chronic kidney disease: Secondary | ICD-10-CM | POA: Diagnosis not present

## 2016-05-03 DIAGNOSIS — E1151 Type 2 diabetes mellitus with diabetic peripheral angiopathy without gangrene: Secondary | ICD-10-CM | POA: Diagnosis not present

## 2016-05-03 DIAGNOSIS — Z7984 Long term (current) use of oral hypoglycemic drugs: Secondary | ICD-10-CM | POA: Diagnosis not present

## 2016-05-03 DIAGNOSIS — N183 Chronic kidney disease, stage 3 (moderate): Secondary | ICD-10-CM | POA: Diagnosis not present

## 2016-05-03 DIAGNOSIS — Z79899 Other long term (current) drug therapy: Secondary | ICD-10-CM | POA: Diagnosis not present

## 2016-05-03 DIAGNOSIS — E1122 Type 2 diabetes mellitus with diabetic chronic kidney disease: Secondary | ICD-10-CM | POA: Diagnosis not present

## 2016-05-10 ENCOUNTER — Encounter: Payer: Self-pay | Admitting: Podiatry

## 2016-05-10 ENCOUNTER — Ambulatory Visit (INDEPENDENT_AMBULATORY_CARE_PROVIDER_SITE_OTHER): Payer: Medicare Other | Admitting: Podiatry

## 2016-05-10 VITALS — BP 129/81 | HR 53

## 2016-05-10 DIAGNOSIS — L84 Corns and callosities: Secondary | ICD-10-CM

## 2016-05-10 DIAGNOSIS — B351 Tinea unguium: Secondary | ICD-10-CM | POA: Diagnosis not present

## 2016-05-10 DIAGNOSIS — E1151 Type 2 diabetes mellitus with diabetic peripheral angiopathy without gangrene: Secondary | ICD-10-CM

## 2016-05-10 NOTE — Progress Notes (Signed)
Patient ID: Valerie Garrison, female   DOB: 1916-01-31, 83 y.o.   MRN: [id number] HPI  Complaint:  Visit Type: Patient returns to my office for continued preventative foot care services. Complaint: Patient states" my nails have grown long and thick and become painful to walk and wear shoes" Patient has been diagnosed with DM with no complications. He presents for preventative foot care services. No changes to ROS.  She also has painful corns third toes both feet. Podiatric Exam: Vascular: dorsalis pedis and posterior tibial pulses are negative. Capillary return is immediate. Temperature gradient is negative. Skin turgor WNL, bilateral swelling  Sensorium: Normal Semmes Weinstein monofilament test. Normal tactile sensation bilaterally.  Nail Exam: Pt has thick disfigured discolored nails with subungual debris noted bilateral entire nail hallux through fifth toenails Ulcer Exam: There is no evidence of ulcer or pre-ulcerative changes or infection. Orthopedic Exam: Muscle tone and strength are WNL. No limitations in general ROM. No crepitus or effusions noted. Foot type and digits show no abnormalities. Bony prominences are unremarkable. Severe HAV B/L Skin: No Porokeratosis. No infection or ulcers.  Distal clavi 3rd toes both feet. Debride heloma durum fifth toe left foot.  Diagnosis:  Tinea unguium, Pain in right toe, pain in left toes,   corn B/L  Treatment & Plan Procedures and Treatment: Consent by patient was obtained for treatment procedures. The patient understood the discussion of treatment and procedures well. All questions were answered thoroughly reviewed. Debridement of mycotic and hypertrophic toenails, 1 through 5 bilateral and clearing of subungual debris. No ulceration, no infection noted. Debride corns. Debride corn fifth toe left foot  under local anesthesia. Return Visit-Office Procedure: Patient instructed to return to the office for a follow up visit 10 weeks  for continued  evaluation and treatment.   Gardiner Barefoot DPM

## 2016-06-07 DIAGNOSIS — E1122 Type 2 diabetes mellitus with diabetic chronic kidney disease: Secondary | ICD-10-CM | POA: Diagnosis not present

## 2016-06-07 DIAGNOSIS — N183 Chronic kidney disease, stage 3 (moderate): Secondary | ICD-10-CM | POA: Diagnosis not present

## 2016-06-07 DIAGNOSIS — M25562 Pain in left knee: Secondary | ICD-10-CM | POA: Diagnosis not present

## 2016-06-07 DIAGNOSIS — E1151 Type 2 diabetes mellitus with diabetic peripheral angiopathy without gangrene: Secondary | ICD-10-CM | POA: Diagnosis not present

## 2016-06-07 DIAGNOSIS — M25561 Pain in right knee: Secondary | ICD-10-CM | POA: Diagnosis not present

## 2016-06-07 DIAGNOSIS — I129 Hypertensive chronic kidney disease with stage 1 through stage 4 chronic kidney disease, or unspecified chronic kidney disease: Secondary | ICD-10-CM | POA: Diagnosis not present

## 2016-06-07 DIAGNOSIS — Z7984 Long term (current) use of oral hypoglycemic drugs: Secondary | ICD-10-CM | POA: Diagnosis not present

## 2016-06-07 DIAGNOSIS — G8929 Other chronic pain: Secondary | ICD-10-CM | POA: Diagnosis not present

## 2016-06-11 DIAGNOSIS — M170 Bilateral primary osteoarthritis of knee: Secondary | ICD-10-CM | POA: Diagnosis not present

## 2016-07-31 ENCOUNTER — Ambulatory Visit: Payer: Medicare Other | Admitting: Podiatry

## 2016-08-09 ENCOUNTER — Encounter: Payer: Self-pay | Admitting: Podiatry

## 2016-08-09 ENCOUNTER — Ambulatory Visit (INDEPENDENT_AMBULATORY_CARE_PROVIDER_SITE_OTHER): Payer: Medicare Other | Admitting: Podiatry

## 2016-08-09 DIAGNOSIS — L84 Corns and callosities: Secondary | ICD-10-CM | POA: Diagnosis not present

## 2016-08-09 DIAGNOSIS — B351 Tinea unguium: Secondary | ICD-10-CM | POA: Diagnosis not present

## 2016-08-09 DIAGNOSIS — E1151 Type 2 diabetes mellitus with diabetic peripheral angiopathy without gangrene: Secondary | ICD-10-CM

## 2016-08-09 NOTE — Progress Notes (Signed)
Patient ID: Valerie Garrison, female   DOB: 1916-03-10, 83 y.o.   MRN: [id number] HPI  Complaint:  Visit Type: Patient returns to my office for continued preventative foot care services. Complaint: Patient states" my nails have grown long and thick and become painful to walk and wear shoes" Patient has been diagnosed with DM with no complications. He presents for preventative foot care services. No changes to ROS.  She also has painful corns third toes both feet. Podiatric Exam: Vascular: dorsalis pedis and posterior tibial pulses are negative. Capillary return is immediate. Temperature gradient is negative. Skin turgor WNL, bilateral swelling  Sensorium: Normal Semmes Weinstein monofilament test. Normal tactile sensation bilaterally.  Nail Exam: Pt has thick disfigured discolored nails with subungual debris noted bilateral entire nail hallux through fifth toenails Ulcer Exam: There is no evidence of ulcer or pre-ulcerative changes or infection. Orthopedic Exam: Muscle tone and strength are WNL. No limitations in general ROM. No crepitus or effusions noted. Foot type and digits show no abnormalities. Bony prominences are unremarkable. Severe HAV B/L.  Contracted digits  B/L Skin: No Porokeratosis. No infection or ulcers.  Distal clavi 3rd toes both feet. Debride heloma durum fifth toe left foot.  Diagnosis:  Tinea unguium, Pain in right toe, pain in left toes,   corn B/L  Treatment & Plan Procedures and Treatment: Consent by patient was obtained for treatment procedures. The patient understood the discussion of treatment and procedures well. All questions were answered thoroughly reviewed. Debridement of mycotic and hypertrophic toenails, 1 through 5 bilateral and clearing of subungual debris. No ulceration, no infection noted. Debride corns. Debride corn fifth toe left foot  under local anesthesia. Return Visit-Office Procedure: Patient instructed to return to the office for a follow up visit 10  weeks  for continued evaluation and treatment.   Gardiner Barefoot DPM

## 2016-10-17 ENCOUNTER — Ambulatory Visit: Payer: Medicare Other | Admitting: Podiatry

## 2016-11-07 ENCOUNTER — Encounter: Payer: Self-pay | Admitting: Podiatry

## 2016-11-07 ENCOUNTER — Ambulatory Visit (INDEPENDENT_AMBULATORY_CARE_PROVIDER_SITE_OTHER): Payer: Medicare Other | Admitting: Podiatry

## 2016-11-07 DIAGNOSIS — L84 Corns and callosities: Secondary | ICD-10-CM | POA: Diagnosis not present

## 2016-11-07 DIAGNOSIS — M79675 Pain in left toe(s): Secondary | ICD-10-CM

## 2016-11-07 DIAGNOSIS — M79674 Pain in right toe(s): Secondary | ICD-10-CM

## 2016-11-07 DIAGNOSIS — B351 Tinea unguium: Secondary | ICD-10-CM

## 2016-11-07 NOTE — Progress Notes (Signed)
Patient ID: Valerie Garrison, female   DOB: 01-26-1916, 83 y.o.   MRN: [id number] HPI  Complaint:  Visit Type: Patient returns to my office for continued preventative foot care services. Complaint: Patient states" my nails have grown long and thick and become painful to walk and wear shoes" Patient has been diagnosed with DM with no complications. He presents for preventative foot care services. No changes to ROS.  She also has painful corns third toes right  Feet. Painful corn fifth toe left foot. Podiatric Exam: Vascular: dorsalis pedis and posterior tibial pulses are negative. Capillary return is immediate. Temperature gradient is negative. Skin turgor WNL, bilateral swelling  Sensorium: Normal Semmes Weinstein monofilament test. Normal tactile sensation bilaterally.  Nail Exam: Pt has thick disfigured discolored nails with subungual debris noted bilateral entire nail hallux through fifth toenails Ulcer Exam: There is no evidence of ulcer or pre-ulcerative changes or infection. Orthopedic Exam: Muscle tone and strength are WNL. No limitations in general ROM. No crepitus or effusions noted. Foot type and digits show no abnormalities. Bony prominences are unremarkable. Severe HAV B/L.  Contracted digits  B/L Skin: No Porokeratosis. No infection or ulcers.  Distal clavi 3rd toes both feet. Debride heloma durum fifth toe left foot.  Diagnosis:  Tinea unguium, Pain in right toe, pain in left toes,   corn B/L  Treatment & Plan Procedures and Treatment: Consent by patient was obtained for treatment procedures.  All questions were answered thoroughly reviewed. Debridement of mycotic and hypertrophic toenails, 1 through 5 bilateral and clearing of subungual debris. No ulceration, no infection noted. Debride corns.  Return Visit-Office Procedure: Patient instructed to return to the office for a follow up visit 10 weeks  for continued evaluation and treatment.   Gardiner Barefoot DPM

## 2016-11-08 DIAGNOSIS — Z7984 Long term (current) use of oral hypoglycemic drugs: Secondary | ICD-10-CM | POA: Diagnosis not present

## 2016-11-08 DIAGNOSIS — Z23 Encounter for immunization: Secondary | ICD-10-CM | POA: Diagnosis not present

## 2016-11-08 DIAGNOSIS — I129 Hypertensive chronic kidney disease with stage 1 through stage 4 chronic kidney disease, or unspecified chronic kidney disease: Secondary | ICD-10-CM | POA: Diagnosis not present

## 2016-11-08 DIAGNOSIS — M170 Bilateral primary osteoarthritis of knee: Secondary | ICD-10-CM | POA: Diagnosis not present

## 2016-11-08 DIAGNOSIS — N183 Chronic kidney disease, stage 3 (moderate): Secondary | ICD-10-CM | POA: Diagnosis not present

## 2016-11-08 DIAGNOSIS — E1151 Type 2 diabetes mellitus with diabetic peripheral angiopathy without gangrene: Secondary | ICD-10-CM | POA: Diagnosis not present

## 2016-11-08 DIAGNOSIS — E1122 Type 2 diabetes mellitus with diabetic chronic kidney disease: Secondary | ICD-10-CM | POA: Diagnosis not present

## 2017-02-05 ENCOUNTER — Ambulatory Visit (INDEPENDENT_AMBULATORY_CARE_PROVIDER_SITE_OTHER): Payer: Medicare Other | Admitting: Podiatry

## 2017-02-05 ENCOUNTER — Encounter: Payer: Self-pay | Admitting: Podiatry

## 2017-02-05 DIAGNOSIS — L84 Corns and callosities: Secondary | ICD-10-CM | POA: Diagnosis not present

## 2017-02-05 DIAGNOSIS — B351 Tinea unguium: Secondary | ICD-10-CM

## 2017-02-05 DIAGNOSIS — M79674 Pain in right toe(s): Secondary | ICD-10-CM

## 2017-02-05 DIAGNOSIS — E1151 Type 2 diabetes mellitus with diabetic peripheral angiopathy without gangrene: Secondary | ICD-10-CM

## 2017-02-05 DIAGNOSIS — M79675 Pain in left toe(s): Secondary | ICD-10-CM

## 2017-02-05 NOTE — Progress Notes (Signed)
Patient ID: Valerie Garrison, female   DOB: Jan 31, 1916, 84 y.o.   MRN: [id number] HPI  Complaint:  Visit Type: Patient returns to my office for continued preventative foot care services. Complaint: Patient states" my nails have grown long and thick and become painful to walk and wear shoes" Patient has been diagnosed with DM with no complications. He presents for preventative foot care services. No changes to ROS.  She also has painful corns third toes right  Feet. Painful corn fifth toe left foot. Podiatric Exam: Vascular: dorsalis pedis and posterior tibial pulses are negative. Capillary return is immediate. Temperature gradient is negative. Skin turgor WNL, bilateral swelling  Sensorium: Normal Semmes Weinstein monofilament test. Normal tactile sensation bilaterally.  Nail Exam: Pt has thick disfigured discolored nails with subungual debris noted bilateral entire nail hallux through fifth toenails Ulcer Exam: There is no evidence of ulcer or pre-ulcerative changes or infection. Orthopedic Exam: Muscle tone and strength are WNL. No limitations in general ROM. No crepitus or effusions noted. Foot type and digits show no abnormalities. Bony prominences are unremarkable. Severe HAV B/L.  Contracted digits  B/L Skin: No Porokeratosis. No infection or ulcers.  Distal clavi 3rd toes both feet. Debride heloma durum fifth toe left foot.  Diagnosis:  Tinea unguium, Pain in right toe, pain in left toes,   corn B/L  Treatment & Plan Procedures and Treatment: Consent by patient was obtained for treatment procedures.  All questions were answered thoroughly reviewed. Debridement of mycotic and hypertrophic toenails, 1 through 5 bilateral and clearing of subungual debris. No ulceration, no infection noted. Debride corns.  Return Visit-Office Procedure: Patient instructed to return to the office for a follow up visit 10 weeks  for continued evaluation and treatment.   Gardiner Barefoot DPM

## 2017-04-16 ENCOUNTER — Ambulatory Visit (INDEPENDENT_AMBULATORY_CARE_PROVIDER_SITE_OTHER): Payer: Medicare Other | Admitting: Podiatry

## 2017-04-16 ENCOUNTER — Encounter: Payer: Self-pay | Admitting: Podiatry

## 2017-04-16 DIAGNOSIS — B351 Tinea unguium: Secondary | ICD-10-CM | POA: Diagnosis not present

## 2017-04-16 DIAGNOSIS — M79674 Pain in right toe(s): Secondary | ICD-10-CM

## 2017-04-16 DIAGNOSIS — E1151 Type 2 diabetes mellitus with diabetic peripheral angiopathy without gangrene: Secondary | ICD-10-CM | POA: Diagnosis not present

## 2017-04-16 DIAGNOSIS — L84 Corns and callosities: Secondary | ICD-10-CM | POA: Diagnosis not present

## 2017-04-16 DIAGNOSIS — M79675 Pain in left toe(s): Secondary | ICD-10-CM

## 2017-04-16 NOTE — Progress Notes (Signed)
Patient ID: Valerie Garrison, female   DOB: 09-10-1916, 84 y.o.   MRN: [id number] HPI  Complaint:  Visit Type: Patient returns to my office for continued preventative foot care services. Complaint: Patient states" my nails have grown long and thick and become painful to walk and wear shoes" Patient has been diagnosed with DM with no complications. He presents for preventative foot care services. No changes to ROS.  She also has painful corns third toes right  . Painful corn fifth toe left foot. Podiatric Exam: Vascular: dorsalis pedis and posterior tibial pulses are negative. Capillary return is immediate. Temperature gradient is negative. Skin turgor WNL, bilateral swelling  Sensorium: Normal Semmes Weinstein monofilament test. Normal tactile sensation bilaterally.  Nail Exam: Pt has thick disfigured discolored nails with subungual debris noted bilateral entire nail hallux through fifth toenails Ulcer Exam: There is no evidence of ulcer or pre-ulcerative changes or infection. Orthopedic Exam: Muscle tone and strength are WNL. No limitations in general ROM. No crepitus or effusions noted. Foot type and digits show no abnormalities. Bony prominences are unremarkable. Severe HAV B/L.  Contracted digits  B/L Skin: No Porokeratosis. No infection or ulcers.  Distal clavi 3rd toes both feet. Debride heloma durum fifth toe left foot.  Diagnosis:  Tinea unguium, Pain in right toe, pain in left toes,   corn B/L  Treatment & Plan Procedures and Treatment: Consent by patient was obtained for treatment procedures.  All questions were answered thoroughly reviewed. Debridement of mycotic and hypertrophic toenails, 1 through 5 bilateral and clearing of subungual debris. No ulceration, no infection noted. Debride corns.  Return Visit-Office Procedure: Patient instructed to return to the office for a follow up visit 10 weeks  for continued evaluation and treatment.   Gardiner Barefoot DPM

## 2017-05-17 DIAGNOSIS — E1122 Type 2 diabetes mellitus with diabetic chronic kidney disease: Secondary | ICD-10-CM | POA: Diagnosis not present

## 2017-05-17 DIAGNOSIS — N183 Chronic kidney disease, stage 3 (moderate): Secondary | ICD-10-CM | POA: Diagnosis not present

## 2017-05-17 DIAGNOSIS — Z7984 Long term (current) use of oral hypoglycemic drugs: Secondary | ICD-10-CM | POA: Diagnosis not present

## 2017-05-17 DIAGNOSIS — I129 Hypertensive chronic kidney disease with stage 1 through stage 4 chronic kidney disease, or unspecified chronic kidney disease: Secondary | ICD-10-CM | POA: Diagnosis not present

## 2017-05-17 DIAGNOSIS — Z79899 Other long term (current) drug therapy: Secondary | ICD-10-CM | POA: Diagnosis not present

## 2017-05-17 DIAGNOSIS — E1151 Type 2 diabetes mellitus with diabetic peripheral angiopathy without gangrene: Secondary | ICD-10-CM | POA: Diagnosis not present

## 2017-05-17 DIAGNOSIS — Z1389 Encounter for screening for other disorder: Secondary | ICD-10-CM | POA: Diagnosis not present

## 2017-05-17 DIAGNOSIS — Z0000 Encounter for general adult medical examination without abnormal findings: Secondary | ICD-10-CM | POA: Diagnosis not present

## 2017-05-17 DIAGNOSIS — D696 Thrombocytopenia, unspecified: Secondary | ICD-10-CM | POA: Diagnosis not present

## 2017-06-28 ENCOUNTER — Ambulatory Visit (INDEPENDENT_AMBULATORY_CARE_PROVIDER_SITE_OTHER): Payer: Medicare Other | Admitting: Podiatry

## 2017-06-28 ENCOUNTER — Encounter: Payer: Self-pay | Admitting: Podiatry

## 2017-06-28 DIAGNOSIS — M79674 Pain in right toe(s): Secondary | ICD-10-CM

## 2017-06-28 DIAGNOSIS — E1151 Type 2 diabetes mellitus with diabetic peripheral angiopathy without gangrene: Secondary | ICD-10-CM

## 2017-06-28 DIAGNOSIS — M79675 Pain in left toe(s): Secondary | ICD-10-CM | POA: Diagnosis not present

## 2017-06-28 DIAGNOSIS — B351 Tinea unguium: Secondary | ICD-10-CM | POA: Diagnosis not present

## 2017-06-28 DIAGNOSIS — L84 Corns and callosities: Secondary | ICD-10-CM | POA: Diagnosis not present

## 2017-06-28 NOTE — Progress Notes (Signed)
Patient ID: Valerie Garrison, female   DOB: 1916-06-14, 84 y.o.   MRN: [id number] HPI  Complaint:  Visit Type: Patient returns to my office for continued preventative foot care services. Complaint: Patient states" my nails have grown long and thick and become painful to walk and wear shoes" Patient has been diagnosed with DM with no complications. He presents for preventative foot care services. No changes to ROS.  She also has painful corns third toes right  . Painful corn fifth toe left foot. Podiatric Exam: Vascular: dorsalis pedis and posterior tibial pulses are negative. Capillary return is immediate. Temperature gradient is negative. Skin turgor WNL, bilateral swelling  Sensorium: Normal Semmes Weinstein monofilament test. Normal tactile sensation bilaterally.  Nail Exam: Pt has thick disfigured discolored nails with subungual debris noted bilateral entire nail hallux through fifth toenails Ulcer Exam: There is no evidence of ulcer or pre-ulcerative changes or infection. Orthopedic Exam: Muscle tone and strength are WNL. No limitations in general ROM. No crepitus or effusions noted. Foot type and digits show no abnormalities. Bony prominences are unremarkable. Severe HAV B/L.  Contracted digits  B/L Skin: No Porokeratosis. No infection or ulcers.  Distal clavi 3rd toes both feet. Debride heloma durum fifth toe left foot.  Diagnosis:  Tinea unguium, Pain in right toe, pain in left toes,   corn B/L  Treatment & Plan Procedures and Treatment: Consent by patient was obtained for treatment procedures.  All questions were answered thoroughly reviewed. Debridement of mycotic and hypertrophic toenails, 1 through 5 bilateral and clearing of subungual debris. No ulceration, no infection noted. Debride corns.  Return Visit-Office Procedure: Patient instructed to return to the office for a follow up visit 10 weeks  for continued evaluation and treatment.   Gardiner Barefoot DPM

## 2017-08-30 ENCOUNTER — Ambulatory Visit (INDEPENDENT_AMBULATORY_CARE_PROVIDER_SITE_OTHER): Payer: Medicare Other | Admitting: Podiatry

## 2017-08-30 ENCOUNTER — Encounter: Payer: Self-pay | Admitting: Podiatry

## 2017-08-30 DIAGNOSIS — L84 Corns and callosities: Secondary | ICD-10-CM

## 2017-08-30 DIAGNOSIS — B351 Tinea unguium: Secondary | ICD-10-CM

## 2017-08-30 DIAGNOSIS — E1151 Type 2 diabetes mellitus with diabetic peripheral angiopathy without gangrene: Secondary | ICD-10-CM

## 2017-08-30 DIAGNOSIS — M79674 Pain in right toe(s): Secondary | ICD-10-CM | POA: Diagnosis not present

## 2017-08-30 DIAGNOSIS — M79675 Pain in left toe(s): Secondary | ICD-10-CM

## 2017-08-30 NOTE — Progress Notes (Signed)
Patient ID: Valerie Garrison, female   DOB: [birth year], 84 y.o.   MRN: [id number] HPI  Complaint:  Visit Type: Patient returns to my office for continued preventative foot care services. Complaint: Patient states" my nails have grown long and thick and become painful to walk and wear shoes" Patient has been diagnosed with DM with no complications. He presents for preventative foot care services. No changes to ROS.  She also has painful corns third toes right  . Painful corn fifth toe left foot. Podiatric Exam: Vascular: dorsalis pedis and posterior tibial pulses are negative. Capillary return is immediate. Temperature gradient is negative. Skin turgor WNL, bilateral swelling  Sensorium: Normal Semmes Weinstein monofilament test. Normal tactile sensation bilaterally.  Nail Exam: Pt has thick disfigured discolored nails with subungual debris noted bilateral entire nail hallux through fifth toenails Ulcer Exam: There is no evidence of ulcer or pre-ulcerative changes or infection. Orthopedic Exam: Muscle tone and strength are WNL. No limitations in general ROM. No crepitus or effusions noted. Foot type and digits show no abnormalities. Bony prominences are unremarkable. Severe HAV B/L.  Contracted digits  B/L Skin: No Porokeratosis. No infection or ulcers.  Distal clavi 3rd toes right foot.. Debride heloma durum fifth toe left foot.  Diagnosis:  Tinea unguium, Pain in right toe, pain in left toes,   corn B/L  Treatment & Plan Procedures and Treatment: Consent by patient was obtained for treatment procedures.  All questions were answered thoroughly reviewed. Debridement of mycotic and hypertrophic toenails, 1 through 5 bilateral and clearing of subungual debris. No ulceration, no infection noted. Debride corns.  Return Visit-Office Procedure: Patient instructed to return to the office for a follow up visit 10 weeks  for continued evaluation and treatment.   Gardiner Barefoot DPM

## 2017-09-25 DIAGNOSIS — N183 Chronic kidney disease, stage 3 (moderate): Secondary | ICD-10-CM | POA: Diagnosis not present

## 2017-09-25 DIAGNOSIS — E1151 Type 2 diabetes mellitus with diabetic peripheral angiopathy without gangrene: Secondary | ICD-10-CM | POA: Diagnosis not present

## 2017-09-25 DIAGNOSIS — I129 Hypertensive chronic kidney disease with stage 1 through stage 4 chronic kidney disease, or unspecified chronic kidney disease: Secondary | ICD-10-CM | POA: Diagnosis not present

## 2017-09-25 DIAGNOSIS — E1122 Type 2 diabetes mellitus with diabetic chronic kidney disease: Secondary | ICD-10-CM | POA: Diagnosis not present

## 2017-09-25 DIAGNOSIS — R269 Unspecified abnormalities of gait and mobility: Secondary | ICD-10-CM | POA: Diagnosis not present

## 2017-09-25 DIAGNOSIS — Z23 Encounter for immunization: Secondary | ICD-10-CM | POA: Diagnosis not present

## 2017-09-25 DIAGNOSIS — Z79899 Other long term (current) drug therapy: Secondary | ICD-10-CM | POA: Diagnosis not present

## 2017-09-30 DIAGNOSIS — I129 Hypertensive chronic kidney disease with stage 1 through stage 4 chronic kidney disease, or unspecified chronic kidney disease: Secondary | ICD-10-CM | POA: Diagnosis not present

## 2017-09-30 DIAGNOSIS — I051 Rheumatic mitral insufficiency: Secondary | ICD-10-CM | POA: Diagnosis not present

## 2017-09-30 DIAGNOSIS — I872 Venous insufficiency (chronic) (peripheral): Secondary | ICD-10-CM | POA: Diagnosis not present

## 2017-09-30 DIAGNOSIS — N183 Chronic kidney disease, stage 3 (moderate): Secondary | ICD-10-CM | POA: Diagnosis not present

## 2017-09-30 DIAGNOSIS — K5900 Constipation, unspecified: Secondary | ICD-10-CM | POA: Diagnosis not present

## 2017-09-30 DIAGNOSIS — G8929 Other chronic pain: Secondary | ICD-10-CM | POA: Diagnosis not present

## 2017-09-30 DIAGNOSIS — E1122 Type 2 diabetes mellitus with diabetic chronic kidney disease: Secondary | ICD-10-CM | POA: Diagnosis not present

## 2017-09-30 DIAGNOSIS — E1151 Type 2 diabetes mellitus with diabetic peripheral angiopathy without gangrene: Secondary | ICD-10-CM | POA: Diagnosis not present

## 2017-09-30 DIAGNOSIS — Z9181 History of falling: Secondary | ICD-10-CM | POA: Diagnosis not present

## 2017-09-30 DIAGNOSIS — M170 Bilateral primary osteoarthritis of knee: Secondary | ICD-10-CM | POA: Diagnosis not present

## 2017-10-03 DIAGNOSIS — N183 Chronic kidney disease, stage 3 (moderate): Secondary | ICD-10-CM | POA: Diagnosis not present

## 2017-10-03 DIAGNOSIS — I129 Hypertensive chronic kidney disease with stage 1 through stage 4 chronic kidney disease, or unspecified chronic kidney disease: Secondary | ICD-10-CM | POA: Diagnosis not present

## 2017-10-03 DIAGNOSIS — E1122 Type 2 diabetes mellitus with diabetic chronic kidney disease: Secondary | ICD-10-CM | POA: Diagnosis not present

## 2017-10-03 DIAGNOSIS — M170 Bilateral primary osteoarthritis of knee: Secondary | ICD-10-CM | POA: Diagnosis not present

## 2017-10-03 DIAGNOSIS — E1151 Type 2 diabetes mellitus with diabetic peripheral angiopathy without gangrene: Secondary | ICD-10-CM | POA: Diagnosis not present

## 2017-10-03 DIAGNOSIS — I872 Venous insufficiency (chronic) (peripheral): Secondary | ICD-10-CM | POA: Diagnosis not present

## 2017-10-08 DIAGNOSIS — M170 Bilateral primary osteoarthritis of knee: Secondary | ICD-10-CM | POA: Diagnosis not present

## 2017-10-08 DIAGNOSIS — I129 Hypertensive chronic kidney disease with stage 1 through stage 4 chronic kidney disease, or unspecified chronic kidney disease: Secondary | ICD-10-CM | POA: Diagnosis not present

## 2017-10-08 DIAGNOSIS — E1151 Type 2 diabetes mellitus with diabetic peripheral angiopathy without gangrene: Secondary | ICD-10-CM | POA: Diagnosis not present

## 2017-10-08 DIAGNOSIS — E1122 Type 2 diabetes mellitus with diabetic chronic kidney disease: Secondary | ICD-10-CM | POA: Diagnosis not present

## 2017-10-08 DIAGNOSIS — N183 Chronic kidney disease, stage 3 (moderate): Secondary | ICD-10-CM | POA: Diagnosis not present

## 2017-10-08 DIAGNOSIS — I872 Venous insufficiency (chronic) (peripheral): Secondary | ICD-10-CM | POA: Diagnosis not present

## 2017-10-11 DIAGNOSIS — I129 Hypertensive chronic kidney disease with stage 1 through stage 4 chronic kidney disease, or unspecified chronic kidney disease: Secondary | ICD-10-CM | POA: Diagnosis not present

## 2017-10-11 DIAGNOSIS — I872 Venous insufficiency (chronic) (peripheral): Secondary | ICD-10-CM | POA: Diagnosis not present

## 2017-10-11 DIAGNOSIS — E1151 Type 2 diabetes mellitus with diabetic peripheral angiopathy without gangrene: Secondary | ICD-10-CM | POA: Diagnosis not present

## 2017-10-11 DIAGNOSIS — M170 Bilateral primary osteoarthritis of knee: Secondary | ICD-10-CM | POA: Diagnosis not present

## 2017-10-11 DIAGNOSIS — E1122 Type 2 diabetes mellitus with diabetic chronic kidney disease: Secondary | ICD-10-CM | POA: Diagnosis not present

## 2017-10-11 DIAGNOSIS — N183 Chronic kidney disease, stage 3 (moderate): Secondary | ICD-10-CM | POA: Diagnosis not present

## 2017-10-15 DIAGNOSIS — I872 Venous insufficiency (chronic) (peripheral): Secondary | ICD-10-CM | POA: Diagnosis not present

## 2017-10-15 DIAGNOSIS — M170 Bilateral primary osteoarthritis of knee: Secondary | ICD-10-CM | POA: Diagnosis not present

## 2017-10-15 DIAGNOSIS — I129 Hypertensive chronic kidney disease with stage 1 through stage 4 chronic kidney disease, or unspecified chronic kidney disease: Secondary | ICD-10-CM | POA: Diagnosis not present

## 2017-10-15 DIAGNOSIS — E1122 Type 2 diabetes mellitus with diabetic chronic kidney disease: Secondary | ICD-10-CM | POA: Diagnosis not present

## 2017-10-15 DIAGNOSIS — N183 Chronic kidney disease, stage 3 (moderate): Secondary | ICD-10-CM | POA: Diagnosis not present

## 2017-10-15 DIAGNOSIS — E1151 Type 2 diabetes mellitus with diabetic peripheral angiopathy without gangrene: Secondary | ICD-10-CM | POA: Diagnosis not present

## 2017-10-17 DIAGNOSIS — E1122 Type 2 diabetes mellitus with diabetic chronic kidney disease: Secondary | ICD-10-CM | POA: Diagnosis not present

## 2017-10-17 DIAGNOSIS — M170 Bilateral primary osteoarthritis of knee: Secondary | ICD-10-CM | POA: Diagnosis not present

## 2017-10-17 DIAGNOSIS — E1151 Type 2 diabetes mellitus with diabetic peripheral angiopathy without gangrene: Secondary | ICD-10-CM | POA: Diagnosis not present

## 2017-10-17 DIAGNOSIS — N183 Chronic kidney disease, stage 3 (moderate): Secondary | ICD-10-CM | POA: Diagnosis not present

## 2017-10-17 DIAGNOSIS — I129 Hypertensive chronic kidney disease with stage 1 through stage 4 chronic kidney disease, or unspecified chronic kidney disease: Secondary | ICD-10-CM | POA: Diagnosis not present

## 2017-10-17 DIAGNOSIS — I872 Venous insufficiency (chronic) (peripheral): Secondary | ICD-10-CM | POA: Diagnosis not present

## 2017-10-22 DIAGNOSIS — I129 Hypertensive chronic kidney disease with stage 1 through stage 4 chronic kidney disease, or unspecified chronic kidney disease: Secondary | ICD-10-CM | POA: Diagnosis not present

## 2017-10-22 DIAGNOSIS — M170 Bilateral primary osteoarthritis of knee: Secondary | ICD-10-CM | POA: Diagnosis not present

## 2017-10-22 DIAGNOSIS — E1151 Type 2 diabetes mellitus with diabetic peripheral angiopathy without gangrene: Secondary | ICD-10-CM | POA: Diagnosis not present

## 2017-10-22 DIAGNOSIS — E1122 Type 2 diabetes mellitus with diabetic chronic kidney disease: Secondary | ICD-10-CM | POA: Diagnosis not present

## 2017-10-22 DIAGNOSIS — N183 Chronic kidney disease, stage 3 (moderate): Secondary | ICD-10-CM | POA: Diagnosis not present

## 2017-10-22 DIAGNOSIS — I872 Venous insufficiency (chronic) (peripheral): Secondary | ICD-10-CM | POA: Diagnosis not present

## 2017-10-24 DIAGNOSIS — M170 Bilateral primary osteoarthritis of knee: Secondary | ICD-10-CM | POA: Diagnosis not present

## 2017-10-24 DIAGNOSIS — N183 Chronic kidney disease, stage 3 (moderate): Secondary | ICD-10-CM | POA: Diagnosis not present

## 2017-10-24 DIAGNOSIS — E1151 Type 2 diabetes mellitus with diabetic peripheral angiopathy without gangrene: Secondary | ICD-10-CM | POA: Diagnosis not present

## 2017-10-24 DIAGNOSIS — I872 Venous insufficiency (chronic) (peripheral): Secondary | ICD-10-CM | POA: Diagnosis not present

## 2017-10-24 DIAGNOSIS — E1122 Type 2 diabetes mellitus with diabetic chronic kidney disease: Secondary | ICD-10-CM | POA: Diagnosis not present

## 2017-10-24 DIAGNOSIS — I129 Hypertensive chronic kidney disease with stage 1 through stage 4 chronic kidney disease, or unspecified chronic kidney disease: Secondary | ICD-10-CM | POA: Diagnosis not present

## 2017-10-29 DIAGNOSIS — I872 Venous insufficiency (chronic) (peripheral): Secondary | ICD-10-CM | POA: Diagnosis not present

## 2017-10-29 DIAGNOSIS — M170 Bilateral primary osteoarthritis of knee: Secondary | ICD-10-CM | POA: Diagnosis not present

## 2017-10-29 DIAGNOSIS — E1122 Type 2 diabetes mellitus with diabetic chronic kidney disease: Secondary | ICD-10-CM | POA: Diagnosis not present

## 2017-10-29 DIAGNOSIS — N183 Chronic kidney disease, stage 3 (moderate): Secondary | ICD-10-CM | POA: Diagnosis not present

## 2017-10-29 DIAGNOSIS — E1151 Type 2 diabetes mellitus with diabetic peripheral angiopathy without gangrene: Secondary | ICD-10-CM | POA: Diagnosis not present

## 2017-10-29 DIAGNOSIS — I129 Hypertensive chronic kidney disease with stage 1 through stage 4 chronic kidney disease, or unspecified chronic kidney disease: Secondary | ICD-10-CM | POA: Diagnosis not present

## 2017-10-31 DIAGNOSIS — E1151 Type 2 diabetes mellitus with diabetic peripheral angiopathy without gangrene: Secondary | ICD-10-CM | POA: Diagnosis not present

## 2017-10-31 DIAGNOSIS — I129 Hypertensive chronic kidney disease with stage 1 through stage 4 chronic kidney disease, or unspecified chronic kidney disease: Secondary | ICD-10-CM | POA: Diagnosis not present

## 2017-10-31 DIAGNOSIS — E1122 Type 2 diabetes mellitus with diabetic chronic kidney disease: Secondary | ICD-10-CM | POA: Diagnosis not present

## 2017-10-31 DIAGNOSIS — N183 Chronic kidney disease, stage 3 (moderate): Secondary | ICD-10-CM | POA: Diagnosis not present

## 2017-10-31 DIAGNOSIS — M170 Bilateral primary osteoarthritis of knee: Secondary | ICD-10-CM | POA: Diagnosis not present

## 2017-10-31 DIAGNOSIS — I872 Venous insufficiency (chronic) (peripheral): Secondary | ICD-10-CM | POA: Diagnosis not present

## 2017-11-01 DIAGNOSIS — I872 Venous insufficiency (chronic) (peripheral): Secondary | ICD-10-CM | POA: Diagnosis not present

## 2017-11-01 DIAGNOSIS — E1122 Type 2 diabetes mellitus with diabetic chronic kidney disease: Secondary | ICD-10-CM | POA: Diagnosis not present

## 2017-11-01 DIAGNOSIS — I129 Hypertensive chronic kidney disease with stage 1 through stage 4 chronic kidney disease, or unspecified chronic kidney disease: Secondary | ICD-10-CM | POA: Diagnosis not present

## 2017-11-01 DIAGNOSIS — N183 Chronic kidney disease, stage 3 (moderate): Secondary | ICD-10-CM | POA: Diagnosis not present

## 2017-11-05 DIAGNOSIS — M170 Bilateral primary osteoarthritis of knee: Secondary | ICD-10-CM | POA: Diagnosis not present

## 2017-11-05 DIAGNOSIS — I872 Venous insufficiency (chronic) (peripheral): Secondary | ICD-10-CM | POA: Diagnosis not present

## 2017-11-05 DIAGNOSIS — E1151 Type 2 diabetes mellitus with diabetic peripheral angiopathy without gangrene: Secondary | ICD-10-CM | POA: Diagnosis not present

## 2017-11-05 DIAGNOSIS — I129 Hypertensive chronic kidney disease with stage 1 through stage 4 chronic kidney disease, or unspecified chronic kidney disease: Secondary | ICD-10-CM | POA: Diagnosis not present

## 2017-11-05 DIAGNOSIS — E1122 Type 2 diabetes mellitus with diabetic chronic kidney disease: Secondary | ICD-10-CM | POA: Diagnosis not present

## 2017-11-05 DIAGNOSIS — N183 Chronic kidney disease, stage 3 (moderate): Secondary | ICD-10-CM | POA: Diagnosis not present

## 2017-11-07 DIAGNOSIS — E1151 Type 2 diabetes mellitus with diabetic peripheral angiopathy without gangrene: Secondary | ICD-10-CM | POA: Diagnosis not present

## 2017-11-07 DIAGNOSIS — E1122 Type 2 diabetes mellitus with diabetic chronic kidney disease: Secondary | ICD-10-CM | POA: Diagnosis not present

## 2017-11-07 DIAGNOSIS — M170 Bilateral primary osteoarthritis of knee: Secondary | ICD-10-CM | POA: Diagnosis not present

## 2017-11-07 DIAGNOSIS — I129 Hypertensive chronic kidney disease with stage 1 through stage 4 chronic kidney disease, or unspecified chronic kidney disease: Secondary | ICD-10-CM | POA: Diagnosis not present

## 2017-11-07 DIAGNOSIS — I872 Venous insufficiency (chronic) (peripheral): Secondary | ICD-10-CM | POA: Diagnosis not present

## 2017-11-07 DIAGNOSIS — N183 Chronic kidney disease, stage 3 (moderate): Secondary | ICD-10-CM | POA: Diagnosis not present

## 2017-11-08 ENCOUNTER — Encounter: Payer: Self-pay | Admitting: Podiatry

## 2017-11-08 ENCOUNTER — Ambulatory Visit (INDEPENDENT_AMBULATORY_CARE_PROVIDER_SITE_OTHER): Payer: Medicare Other | Admitting: Podiatry

## 2017-11-08 DIAGNOSIS — B351 Tinea unguium: Secondary | ICD-10-CM | POA: Diagnosis not present

## 2017-11-08 DIAGNOSIS — E1151 Type 2 diabetes mellitus with diabetic peripheral angiopathy without gangrene: Secondary | ICD-10-CM | POA: Diagnosis not present

## 2017-11-08 DIAGNOSIS — M79675 Pain in left toe(s): Secondary | ICD-10-CM | POA: Diagnosis not present

## 2017-11-08 DIAGNOSIS — M79674 Pain in right toe(s): Secondary | ICD-10-CM | POA: Diagnosis not present

## 2017-11-08 DIAGNOSIS — L84 Corns and callosities: Secondary | ICD-10-CM

## 2017-11-08 NOTE — Progress Notes (Signed)
Patient ID: Valerie Garrison, female   DOB: 1916-04-21, 84 y.o.   MRN: [id number] HPI  Complaint:  Visit Type: Patient returns to my office for continued preventative foot care services. Complaint: Patient states" my nails have grown long and thick and become painful to walk and wear shoes" Patient has been diagnosed with DM with no complications. He presents for preventative foot care services. No changes to ROS.  She also has painful corns third toes right  . Painful corn fifth toe left foot. Podiatric Exam: Vascular: dorsalis pedis and posterior tibial pulses are negative. Capillary return is immediate. Temperature gradient is negative. Skin turgor WNL, bilateral swelling  Sensorium: Normal Semmes Weinstein monofilament test. Normal tactile sensation bilaterally.  Nail Exam: Pt has thick disfigured discolored nails with subungual debris noted bilateral entire nail hallux through fifth toenails Ulcer Exam: There is no evidence of ulcer or pre-ulcerative changes or infection. Orthopedic Exam: Muscle tone and strength are WNL. No limitations in general ROM. No crepitus or effusions noted. Foot type and digits show no abnormalities. Bony prominences are unremarkable. Severe HAV B/L.  Contracted digits  B/L Skin: No Porokeratosis. No infection or ulcers.  Distal clavi 3rd toes right foot.. Debride heloma durum fifth toe left foot.  Diagnosis:  Tinea unguium, Pain in right toe, pain in left toes,   corn B/L  Treatment & Plan Procedures and Treatment: Consent by patient was obtained for treatment procedures.  All questions were answered thoroughly reviewed. Debridement of mycotic and hypertrophic toenails, 1 through 5 bilateral and clearing of subungual debris. No ulceration, no infection noted. Debride corns.  Return Visit-Office Procedure: Patient instructed to return to the office for a follow up visit 10 weeks  for continued evaluation and treatment.   Gardiner Barefoot DPM

## 2017-11-12 DIAGNOSIS — I129 Hypertensive chronic kidney disease with stage 1 through stage 4 chronic kidney disease, or unspecified chronic kidney disease: Secondary | ICD-10-CM | POA: Diagnosis not present

## 2017-11-12 DIAGNOSIS — N183 Chronic kidney disease, stage 3 (moderate): Secondary | ICD-10-CM | POA: Diagnosis not present

## 2017-11-12 DIAGNOSIS — E1151 Type 2 diabetes mellitus with diabetic peripheral angiopathy without gangrene: Secondary | ICD-10-CM | POA: Diagnosis not present

## 2017-11-12 DIAGNOSIS — E1122 Type 2 diabetes mellitus with diabetic chronic kidney disease: Secondary | ICD-10-CM | POA: Diagnosis not present

## 2017-11-12 DIAGNOSIS — M170 Bilateral primary osteoarthritis of knee: Secondary | ICD-10-CM | POA: Diagnosis not present

## 2017-11-12 DIAGNOSIS — I872 Venous insufficiency (chronic) (peripheral): Secondary | ICD-10-CM | POA: Diagnosis not present

## 2017-11-14 DIAGNOSIS — N183 Chronic kidney disease, stage 3 (moderate): Secondary | ICD-10-CM | POA: Diagnosis not present

## 2017-11-14 DIAGNOSIS — E1122 Type 2 diabetes mellitus with diabetic chronic kidney disease: Secondary | ICD-10-CM | POA: Diagnosis not present

## 2017-11-14 DIAGNOSIS — I129 Hypertensive chronic kidney disease with stage 1 through stage 4 chronic kidney disease, or unspecified chronic kidney disease: Secondary | ICD-10-CM | POA: Diagnosis not present

## 2017-11-14 DIAGNOSIS — E1151 Type 2 diabetes mellitus with diabetic peripheral angiopathy without gangrene: Secondary | ICD-10-CM | POA: Diagnosis not present

## 2017-11-14 DIAGNOSIS — M170 Bilateral primary osteoarthritis of knee: Secondary | ICD-10-CM | POA: Diagnosis not present

## 2017-11-14 DIAGNOSIS — I872 Venous insufficiency (chronic) (peripheral): Secondary | ICD-10-CM | POA: Diagnosis not present

## 2017-11-19 DIAGNOSIS — N183 Chronic kidney disease, stage 3 (moderate): Secondary | ICD-10-CM | POA: Diagnosis not present

## 2017-11-19 DIAGNOSIS — I129 Hypertensive chronic kidney disease with stage 1 through stage 4 chronic kidney disease, or unspecified chronic kidney disease: Secondary | ICD-10-CM | POA: Diagnosis not present

## 2017-11-19 DIAGNOSIS — E1151 Type 2 diabetes mellitus with diabetic peripheral angiopathy without gangrene: Secondary | ICD-10-CM | POA: Diagnosis not present

## 2017-11-19 DIAGNOSIS — M170 Bilateral primary osteoarthritis of knee: Secondary | ICD-10-CM | POA: Diagnosis not present

## 2017-11-19 DIAGNOSIS — I872 Venous insufficiency (chronic) (peripheral): Secondary | ICD-10-CM | POA: Diagnosis not present

## 2017-11-19 DIAGNOSIS — E1122 Type 2 diabetes mellitus with diabetic chronic kidney disease: Secondary | ICD-10-CM | POA: Diagnosis not present

## 2017-11-21 DIAGNOSIS — I129 Hypertensive chronic kidney disease with stage 1 through stage 4 chronic kidney disease, or unspecified chronic kidney disease: Secondary | ICD-10-CM | POA: Diagnosis not present

## 2017-11-21 DIAGNOSIS — N183 Chronic kidney disease, stage 3 (moderate): Secondary | ICD-10-CM | POA: Diagnosis not present

## 2017-11-21 DIAGNOSIS — I872 Venous insufficiency (chronic) (peripheral): Secondary | ICD-10-CM | POA: Diagnosis not present

## 2017-11-21 DIAGNOSIS — E1151 Type 2 diabetes mellitus with diabetic peripheral angiopathy without gangrene: Secondary | ICD-10-CM | POA: Diagnosis not present

## 2017-11-21 DIAGNOSIS — M170 Bilateral primary osteoarthritis of knee: Secondary | ICD-10-CM | POA: Diagnosis not present

## 2017-11-21 DIAGNOSIS — E1122 Type 2 diabetes mellitus with diabetic chronic kidney disease: Secondary | ICD-10-CM | POA: Diagnosis not present

## 2017-11-22 DIAGNOSIS — E1122 Type 2 diabetes mellitus with diabetic chronic kidney disease: Secondary | ICD-10-CM | POA: Diagnosis not present

## 2017-11-22 DIAGNOSIS — I129 Hypertensive chronic kidney disease with stage 1 through stage 4 chronic kidney disease, or unspecified chronic kidney disease: Secondary | ICD-10-CM | POA: Diagnosis not present

## 2017-11-22 DIAGNOSIS — N183 Chronic kidney disease, stage 3 (moderate): Secondary | ICD-10-CM | POA: Diagnosis not present

## 2017-11-22 DIAGNOSIS — R05 Cough: Secondary | ICD-10-CM | POA: Diagnosis not present

## 2017-11-22 DIAGNOSIS — E1151 Type 2 diabetes mellitus with diabetic peripheral angiopathy without gangrene: Secondary | ICD-10-CM | POA: Diagnosis not present

## 2017-11-26 DIAGNOSIS — I129 Hypertensive chronic kidney disease with stage 1 through stage 4 chronic kidney disease, or unspecified chronic kidney disease: Secondary | ICD-10-CM | POA: Diagnosis not present

## 2017-11-26 DIAGNOSIS — M170 Bilateral primary osteoarthritis of knee: Secondary | ICD-10-CM | POA: Diagnosis not present

## 2017-11-26 DIAGNOSIS — I872 Venous insufficiency (chronic) (peripheral): Secondary | ICD-10-CM | POA: Diagnosis not present

## 2017-11-26 DIAGNOSIS — E1122 Type 2 diabetes mellitus with diabetic chronic kidney disease: Secondary | ICD-10-CM | POA: Diagnosis not present

## 2017-11-26 DIAGNOSIS — E1151 Type 2 diabetes mellitus with diabetic peripheral angiopathy without gangrene: Secondary | ICD-10-CM | POA: Diagnosis not present

## 2017-11-26 DIAGNOSIS — N183 Chronic kidney disease, stage 3 (moderate): Secondary | ICD-10-CM | POA: Diagnosis not present

## 2017-11-28 DIAGNOSIS — I129 Hypertensive chronic kidney disease with stage 1 through stage 4 chronic kidney disease, or unspecified chronic kidney disease: Secondary | ICD-10-CM | POA: Diagnosis not present

## 2017-11-28 DIAGNOSIS — E1122 Type 2 diabetes mellitus with diabetic chronic kidney disease: Secondary | ICD-10-CM | POA: Diagnosis not present

## 2017-11-28 DIAGNOSIS — E1151 Type 2 diabetes mellitus with diabetic peripheral angiopathy without gangrene: Secondary | ICD-10-CM | POA: Diagnosis not present

## 2017-11-28 DIAGNOSIS — M170 Bilateral primary osteoarthritis of knee: Secondary | ICD-10-CM | POA: Diagnosis not present

## 2017-11-28 DIAGNOSIS — N183 Chronic kidney disease, stage 3 (moderate): Secondary | ICD-10-CM | POA: Diagnosis not present

## 2017-11-28 DIAGNOSIS — I872 Venous insufficiency (chronic) (peripheral): Secondary | ICD-10-CM | POA: Diagnosis not present

## 2018-01-17 ENCOUNTER — Ambulatory Visit (INDEPENDENT_AMBULATORY_CARE_PROVIDER_SITE_OTHER): Payer: Medicare Other | Admitting: Podiatry

## 2018-01-17 ENCOUNTER — Encounter: Payer: Self-pay | Admitting: Podiatry

## 2018-01-17 DIAGNOSIS — E1151 Type 2 diabetes mellitus with diabetic peripheral angiopathy without gangrene: Secondary | ICD-10-CM

## 2018-01-17 DIAGNOSIS — B351 Tinea unguium: Secondary | ICD-10-CM | POA: Diagnosis not present

## 2018-01-17 DIAGNOSIS — M79674 Pain in right toe(s): Secondary | ICD-10-CM

## 2018-01-17 DIAGNOSIS — M79675 Pain in left toe(s): Secondary | ICD-10-CM | POA: Diagnosis not present

## 2018-01-17 DIAGNOSIS — L84 Corns and callosities: Secondary | ICD-10-CM

## 2018-01-17 NOTE — Progress Notes (Signed)
Patient ID: Valerie Garrison, female   DOB: 12-Sep-1916, 85 y.o.   MRN: [id number] HPI  Complaint:  Visit Type: Patient returns to my office for continued preventative foot care services. Complaint: Patient states" my nails have grown long and thick and become painful to walk and wear shoes" Patient has been diagnosed with DM with no complications. He presents for preventative foot care services. No changes to ROS.  She also has painful corns third toes right  . Painful corn fifth toe left foot. Podiatric Exam: Vascular: dorsalis pedis and posterior tibial pulses are negative. Capillary return is immediate. Temperature gradient is negative. Skin turgor WNL, bilateral swelling  Sensorium: Normal Semmes Weinstein monofilament test. Normal tactile sensation bilaterally.  Nail Exam: Pt has thick disfigured discolored nails with subungual debris noted bilateral entire nail hallux through fifth toenails Ulcer Exam: There is no evidence of ulcer or pre-ulcerative changes or infection. Orthopedic Exam: Muscle tone and strength are WNL. No limitations in general ROM. No crepitus or effusions noted. Foot type and digits show no abnormalities. Bony prominences are unremarkable. Severe HAV B/L.  Contracted digits  B/L Skin: No Porokeratosis. No infection or ulcers.  Distal clavi 3rd toes right foot.. Debride heloma durum fifth toe left foot.  Diagnosis:  Tinea unguium, Pain in right toe, pain in left toes,   corn B/L  Treatment & Plan Procedures and Treatment: Consent by patient was obtained for treatment procedures.  All questions were answered thoroughly reviewed. Debridement of mycotic and hypertrophic toenails, 1 through 5 bilateral and clearing of subungual debris. No ulceration, no infection noted. Debride corns.  Return Visit-Office Procedure: Patient instructed to return to the office for a follow up visit 10 weeks  for continued evaluation and treatment.   Gardiner Barefoot DPM

## 2018-03-12 DIAGNOSIS — H02403 Unspecified ptosis of bilateral eyelids: Secondary | ICD-10-CM | POA: Diagnosis not present

## 2018-03-12 DIAGNOSIS — H2513 Age-related nuclear cataract, bilateral: Secondary | ICD-10-CM | POA: Diagnosis not present

## 2018-03-12 DIAGNOSIS — H04123 Dry eye syndrome of bilateral lacrimal glands: Secondary | ICD-10-CM | POA: Diagnosis not present

## 2018-03-12 DIAGNOSIS — E119 Type 2 diabetes mellitus without complications: Secondary | ICD-10-CM | POA: Diagnosis not present

## 2018-03-28 ENCOUNTER — Encounter: Payer: Self-pay | Admitting: Podiatry

## 2018-03-28 ENCOUNTER — Other Ambulatory Visit: Payer: Self-pay

## 2018-03-28 ENCOUNTER — Ambulatory Visit (INDEPENDENT_AMBULATORY_CARE_PROVIDER_SITE_OTHER): Payer: Medicare Other | Admitting: Podiatry

## 2018-03-28 DIAGNOSIS — M79675 Pain in left toe(s): Secondary | ICD-10-CM | POA: Diagnosis not present

## 2018-03-28 DIAGNOSIS — L84 Corns and callosities: Secondary | ICD-10-CM | POA: Diagnosis not present

## 2018-03-28 DIAGNOSIS — E1151 Type 2 diabetes mellitus with diabetic peripheral angiopathy without gangrene: Secondary | ICD-10-CM

## 2018-03-28 DIAGNOSIS — B351 Tinea unguium: Secondary | ICD-10-CM | POA: Diagnosis not present

## 2018-03-28 DIAGNOSIS — M79674 Pain in right toe(s): Secondary | ICD-10-CM

## 2018-03-28 NOTE — Progress Notes (Signed)
Patient ID: Valerie Garrison, female   DOB: 28-Jun-1916, 85 y.o.   MRN: [id number] HPI  Complaint:  Visit Type: Patient returns to my office for continued preventative foot care services. Complaint: Patient states" my nails have grown long and thick and become painful to walk and wear shoes" Patient has been diagnosed with DM with no complications. He presents for preventative foot care services. No changes to ROS.  She also has painful corns third toes right  . Painful corn fifth toe left foot. Podiatric Exam: Vascular: dorsalis pedis and posterior tibial pulses are negative. Capillary return is immediate. Temperature gradient is negative. Skin turgor WNL, bilateral swelling  Sensorium: Normal Semmes Weinstein monofilament test. Normal tactile sensation bilaterally.  Nail Exam: Pt has thick disfigured discolored nails with subungual debris noted bilateral entire nail hallux through fifth toenails Ulcer Exam: There is no evidence of ulcer or pre-ulcerative changes or infection. Orthopedic Exam: Muscle tone and strength are WNL. No limitations in general ROM. No crepitus or effusions noted. Foot type and digits show no abnormalities. Bony prominences are unremarkable. Severe HAV B/L.  Contracted digits  B/L Skin: No Porokeratosis. No infection or ulcers.  Distal clavi 3rd toes right foot.. Debride heloma durum fifth toe left foot.  Diagnosis:  Tinea unguium, Pain in right toe, pain in left toes,   corn B/L  Treatment & Plan Procedures and Treatment: Consent by patient was obtained for treatment procedures.  All questions were answered thoroughly reviewed. Debridement of mycotic and hypertrophic toenails, 1 through 5 bilateral and clearing of subungual debris. No ulceration, no infection noted. Debride corns.  Return Visit-Office Procedure: Patient instructed to return to the office for a follow up visit 10 weeks  for continued evaluation and treatment.   Gardiner Barefoot DPM

## 2018-06-06 ENCOUNTER — Encounter: Payer: Self-pay | Admitting: Podiatry

## 2018-06-06 ENCOUNTER — Other Ambulatory Visit: Payer: Self-pay

## 2018-06-06 ENCOUNTER — Ambulatory Visit (INDEPENDENT_AMBULATORY_CARE_PROVIDER_SITE_OTHER): Payer: Medicare Other | Admitting: Podiatry

## 2018-06-06 DIAGNOSIS — M79675 Pain in left toe(s): Secondary | ICD-10-CM | POA: Diagnosis not present

## 2018-06-06 DIAGNOSIS — B351 Tinea unguium: Secondary | ICD-10-CM | POA: Diagnosis not present

## 2018-06-06 DIAGNOSIS — L84 Corns and callosities: Secondary | ICD-10-CM | POA: Diagnosis not present

## 2018-06-06 DIAGNOSIS — E1151 Type 2 diabetes mellitus with diabetic peripheral angiopathy without gangrene: Secondary | ICD-10-CM | POA: Diagnosis not present

## 2018-06-06 DIAGNOSIS — M79674 Pain in right toe(s): Secondary | ICD-10-CM | POA: Diagnosis not present

## 2018-06-06 NOTE — Progress Notes (Signed)
Patient ID: Valerie Garrison, female   DOB: [birth year], 85 y.o.   MRN: [id number] HPI  Complaint:  Visit Type: Patient returns to my office for continued preventative foot care services. Complaint: Patient states" my nails have grown long and thick and become painful to walk and wear shoes" Patient has been diagnosed with DM with no complications. He presents for preventative foot care services. No changes to ROS.  She also has painful corns third toes right  . Painful corn fifth toe left foot. Podiatric Exam: Vascular: dorsalis pedis and posterior tibial pulses are negative. Capillary return is immediate. Temperature gradient is negative. Skin turgor WNL, bilateral swelling  Sensorium: Normal Semmes Weinstein monofilament test. Normal tactile sensation bilaterally.  Nail Exam: Pt has thick disfigured discolored nails with subungual debris noted bilateral entire nail hallux through fifth toenails Ulcer Exam: There is no evidence of ulcer or pre-ulcerative changes or infection. Orthopedic Exam: Muscle tone and strength are WNL. No limitations in general ROM. No crepitus or effusions noted. Foot type and digits show no abnormalities. Bony prominences are unremarkable. Severe HAV B/L.  Contracted digits  B/L Skin: No Porokeratosis. No infection or ulcers.  Distal clavi 3rd toes right foot.. Debride heloma durum fifth toe left foot.  Diagnosis:  Tinea unguium, Pain in right toe, pain in left toes,   corn B/L  Treatment & Plan Procedures and Treatment: Consent by patient was obtained for treatment procedures.  All questions were answered thoroughly reviewed. Debridement of mycotic and hypertrophic toenails, 1 through 5 bilateral and clearing of subungual debris. No ulceration, no infection noted. Debride corns.  Return Visit-Office Procedure: Patient instructed to return to the office for a follow up visit 10 weeks  for continued evaluation and treatment.   Gardiner Barefoot DPM

## 2018-06-11 DIAGNOSIS — L97521 Non-pressure chronic ulcer of other part of left foot limited to breakdown of skin: Secondary | ICD-10-CM | POA: Diagnosis not present

## 2018-06-11 DIAGNOSIS — I129 Hypertensive chronic kidney disease with stage 1 through stage 4 chronic kidney disease, or unspecified chronic kidney disease: Secondary | ICD-10-CM | POA: Diagnosis not present

## 2018-06-11 DIAGNOSIS — Z79899 Other long term (current) drug therapy: Secondary | ICD-10-CM | POA: Diagnosis not present

## 2018-06-11 DIAGNOSIS — Z1389 Encounter for screening for other disorder: Secondary | ICD-10-CM | POA: Diagnosis not present

## 2018-06-11 DIAGNOSIS — R413 Other amnesia: Secondary | ICD-10-CM | POA: Diagnosis not present

## 2018-06-11 DIAGNOSIS — E1151 Type 2 diabetes mellitus with diabetic peripheral angiopathy without gangrene: Secondary | ICD-10-CM | POA: Diagnosis not present

## 2018-06-11 DIAGNOSIS — D696 Thrombocytopenia, unspecified: Secondary | ICD-10-CM | POA: Diagnosis not present

## 2018-06-11 DIAGNOSIS — M25561 Pain in right knee: Secondary | ICD-10-CM | POA: Diagnosis not present

## 2018-06-11 DIAGNOSIS — N183 Chronic kidney disease, stage 3 (moderate): Secondary | ICD-10-CM | POA: Diagnosis not present

## 2018-06-11 DIAGNOSIS — R269 Unspecified abnormalities of gait and mobility: Secondary | ICD-10-CM | POA: Diagnosis not present

## 2018-06-11 DIAGNOSIS — Z0000 Encounter for general adult medical examination without abnormal findings: Secondary | ICD-10-CM | POA: Diagnosis not present

## 2018-06-11 DIAGNOSIS — E1122 Type 2 diabetes mellitus with diabetic chronic kidney disease: Secondary | ICD-10-CM | POA: Diagnosis not present

## 2018-06-16 DIAGNOSIS — M170 Bilateral primary osteoarthritis of knee: Secondary | ICD-10-CM | POA: Diagnosis not present

## 2018-06-25 DIAGNOSIS — E11621 Type 2 diabetes mellitus with foot ulcer: Secondary | ICD-10-CM | POA: Diagnosis not present

## 2018-06-25 DIAGNOSIS — L97521 Non-pressure chronic ulcer of other part of left foot limited to breakdown of skin: Secondary | ICD-10-CM | POA: Diagnosis not present

## 2018-06-25 DIAGNOSIS — M170 Bilateral primary osteoarthritis of knee: Secondary | ICD-10-CM | POA: Diagnosis not present

## 2018-06-25 DIAGNOSIS — I872 Venous insufficiency (chronic) (peripheral): Secondary | ICD-10-CM | POA: Diagnosis not present

## 2018-06-25 DIAGNOSIS — I129 Hypertensive chronic kidney disease with stage 1 through stage 4 chronic kidney disease, or unspecified chronic kidney disease: Secondary | ICD-10-CM | POA: Diagnosis not present

## 2018-06-25 DIAGNOSIS — E1122 Type 2 diabetes mellitus with diabetic chronic kidney disease: Secondary | ICD-10-CM | POA: Diagnosis not present

## 2018-06-25 DIAGNOSIS — E1151 Type 2 diabetes mellitus with diabetic peripheral angiopathy without gangrene: Secondary | ICD-10-CM | POA: Diagnosis not present

## 2018-06-25 DIAGNOSIS — N183 Chronic kidney disease, stage 3 (moderate): Secondary | ICD-10-CM | POA: Diagnosis not present

## 2018-06-30 DIAGNOSIS — E1151 Type 2 diabetes mellitus with diabetic peripheral angiopathy without gangrene: Secondary | ICD-10-CM | POA: Diagnosis not present

## 2018-06-30 DIAGNOSIS — I872 Venous insufficiency (chronic) (peripheral): Secondary | ICD-10-CM | POA: Diagnosis not present

## 2018-06-30 DIAGNOSIS — L97521 Non-pressure chronic ulcer of other part of left foot limited to breakdown of skin: Secondary | ICD-10-CM | POA: Diagnosis not present

## 2018-06-30 DIAGNOSIS — E1122 Type 2 diabetes mellitus with diabetic chronic kidney disease: Secondary | ICD-10-CM | POA: Diagnosis not present

## 2018-06-30 DIAGNOSIS — M170 Bilateral primary osteoarthritis of knee: Secondary | ICD-10-CM | POA: Diagnosis not present

## 2018-06-30 DIAGNOSIS — E11621 Type 2 diabetes mellitus with foot ulcer: Secondary | ICD-10-CM | POA: Diagnosis not present

## 2018-07-02 DIAGNOSIS — M170 Bilateral primary osteoarthritis of knee: Secondary | ICD-10-CM | POA: Diagnosis not present

## 2018-07-02 DIAGNOSIS — L97521 Non-pressure chronic ulcer of other part of left foot limited to breakdown of skin: Secondary | ICD-10-CM | POA: Diagnosis not present

## 2018-07-02 DIAGNOSIS — E11621 Type 2 diabetes mellitus with foot ulcer: Secondary | ICD-10-CM | POA: Diagnosis not present

## 2018-07-02 DIAGNOSIS — I872 Venous insufficiency (chronic) (peripheral): Secondary | ICD-10-CM | POA: Diagnosis not present

## 2018-07-02 DIAGNOSIS — E1151 Type 2 diabetes mellitus with diabetic peripheral angiopathy without gangrene: Secondary | ICD-10-CM | POA: Diagnosis not present

## 2018-07-02 DIAGNOSIS — E1122 Type 2 diabetes mellitus with diabetic chronic kidney disease: Secondary | ICD-10-CM | POA: Diagnosis not present

## 2018-07-07 DIAGNOSIS — I872 Venous insufficiency (chronic) (peripheral): Secondary | ICD-10-CM | POA: Diagnosis not present

## 2018-07-07 DIAGNOSIS — M170 Bilateral primary osteoarthritis of knee: Secondary | ICD-10-CM | POA: Diagnosis not present

## 2018-07-07 DIAGNOSIS — E11621 Type 2 diabetes mellitus with foot ulcer: Secondary | ICD-10-CM | POA: Diagnosis not present

## 2018-07-07 DIAGNOSIS — L97521 Non-pressure chronic ulcer of other part of left foot limited to breakdown of skin: Secondary | ICD-10-CM | POA: Diagnosis not present

## 2018-07-07 DIAGNOSIS — E1151 Type 2 diabetes mellitus with diabetic peripheral angiopathy without gangrene: Secondary | ICD-10-CM | POA: Diagnosis not present

## 2018-07-07 DIAGNOSIS — E1122 Type 2 diabetes mellitus with diabetic chronic kidney disease: Secondary | ICD-10-CM | POA: Diagnosis not present

## 2018-07-09 DIAGNOSIS — M170 Bilateral primary osteoarthritis of knee: Secondary | ICD-10-CM | POA: Diagnosis not present

## 2018-07-09 DIAGNOSIS — E11621 Type 2 diabetes mellitus with foot ulcer: Secondary | ICD-10-CM | POA: Diagnosis not present

## 2018-07-09 DIAGNOSIS — E1122 Type 2 diabetes mellitus with diabetic chronic kidney disease: Secondary | ICD-10-CM | POA: Diagnosis not present

## 2018-07-09 DIAGNOSIS — I872 Venous insufficiency (chronic) (peripheral): Secondary | ICD-10-CM | POA: Diagnosis not present

## 2018-07-09 DIAGNOSIS — L97521 Non-pressure chronic ulcer of other part of left foot limited to breakdown of skin: Secondary | ICD-10-CM | POA: Diagnosis not present

## 2018-07-09 DIAGNOSIS — E1151 Type 2 diabetes mellitus with diabetic peripheral angiopathy without gangrene: Secondary | ICD-10-CM | POA: Diagnosis not present

## 2018-07-14 DIAGNOSIS — M170 Bilateral primary osteoarthritis of knee: Secondary | ICD-10-CM | POA: Diagnosis not present

## 2018-07-14 DIAGNOSIS — I872 Venous insufficiency (chronic) (peripheral): Secondary | ICD-10-CM | POA: Diagnosis not present

## 2018-07-14 DIAGNOSIS — E1122 Type 2 diabetes mellitus with diabetic chronic kidney disease: Secondary | ICD-10-CM | POA: Diagnosis not present

## 2018-07-14 DIAGNOSIS — E1151 Type 2 diabetes mellitus with diabetic peripheral angiopathy without gangrene: Secondary | ICD-10-CM | POA: Diagnosis not present

## 2018-07-14 DIAGNOSIS — E11621 Type 2 diabetes mellitus with foot ulcer: Secondary | ICD-10-CM | POA: Diagnosis not present

## 2018-07-14 DIAGNOSIS — L97521 Non-pressure chronic ulcer of other part of left foot limited to breakdown of skin: Secondary | ICD-10-CM | POA: Diagnosis not present

## 2018-07-25 DIAGNOSIS — E1122 Type 2 diabetes mellitus with diabetic chronic kidney disease: Secondary | ICD-10-CM | POA: Diagnosis not present

## 2018-07-25 DIAGNOSIS — N183 Chronic kidney disease, stage 3 (moderate): Secondary | ICD-10-CM | POA: Diagnosis not present

## 2018-07-25 DIAGNOSIS — L97521 Non-pressure chronic ulcer of other part of left foot limited to breakdown of skin: Secondary | ICD-10-CM | POA: Diagnosis not present

## 2018-07-25 DIAGNOSIS — E11621 Type 2 diabetes mellitus with foot ulcer: Secondary | ICD-10-CM | POA: Diagnosis not present

## 2018-07-25 DIAGNOSIS — M170 Bilateral primary osteoarthritis of knee: Secondary | ICD-10-CM | POA: Diagnosis not present

## 2018-07-25 DIAGNOSIS — I129 Hypertensive chronic kidney disease with stage 1 through stage 4 chronic kidney disease, or unspecified chronic kidney disease: Secondary | ICD-10-CM | POA: Diagnosis not present

## 2018-07-25 DIAGNOSIS — I872 Venous insufficiency (chronic) (peripheral): Secondary | ICD-10-CM | POA: Diagnosis not present

## 2018-07-25 DIAGNOSIS — E1151 Type 2 diabetes mellitus with diabetic peripheral angiopathy without gangrene: Secondary | ICD-10-CM | POA: Diagnosis not present

## 2018-07-28 DIAGNOSIS — E1122 Type 2 diabetes mellitus with diabetic chronic kidney disease: Secondary | ICD-10-CM | POA: Diagnosis not present

## 2018-07-28 DIAGNOSIS — I872 Venous insufficiency (chronic) (peripheral): Secondary | ICD-10-CM | POA: Diagnosis not present

## 2018-07-28 DIAGNOSIS — E11621 Type 2 diabetes mellitus with foot ulcer: Secondary | ICD-10-CM | POA: Diagnosis not present

## 2018-07-28 DIAGNOSIS — L97521 Non-pressure chronic ulcer of other part of left foot limited to breakdown of skin: Secondary | ICD-10-CM | POA: Diagnosis not present

## 2018-07-28 DIAGNOSIS — M170 Bilateral primary osteoarthritis of knee: Secondary | ICD-10-CM | POA: Diagnosis not present

## 2018-07-28 DIAGNOSIS — E1151 Type 2 diabetes mellitus with diabetic peripheral angiopathy without gangrene: Secondary | ICD-10-CM | POA: Diagnosis not present

## 2018-08-06 DIAGNOSIS — E1151 Type 2 diabetes mellitus with diabetic peripheral angiopathy without gangrene: Secondary | ICD-10-CM | POA: Diagnosis not present

## 2018-08-06 DIAGNOSIS — M170 Bilateral primary osteoarthritis of knee: Secondary | ICD-10-CM | POA: Diagnosis not present

## 2018-08-06 DIAGNOSIS — E11621 Type 2 diabetes mellitus with foot ulcer: Secondary | ICD-10-CM | POA: Diagnosis not present

## 2018-08-06 DIAGNOSIS — I872 Venous insufficiency (chronic) (peripheral): Secondary | ICD-10-CM | POA: Diagnosis not present

## 2018-08-06 DIAGNOSIS — L97521 Non-pressure chronic ulcer of other part of left foot limited to breakdown of skin: Secondary | ICD-10-CM | POA: Diagnosis not present

## 2018-08-06 DIAGNOSIS — E1122 Type 2 diabetes mellitus with diabetic chronic kidney disease: Secondary | ICD-10-CM | POA: Diagnosis not present

## 2018-08-11 DIAGNOSIS — E1151 Type 2 diabetes mellitus with diabetic peripheral angiopathy without gangrene: Secondary | ICD-10-CM | POA: Diagnosis not present

## 2018-08-11 DIAGNOSIS — E1122 Type 2 diabetes mellitus with diabetic chronic kidney disease: Secondary | ICD-10-CM | POA: Diagnosis not present

## 2018-08-11 DIAGNOSIS — L97521 Non-pressure chronic ulcer of other part of left foot limited to breakdown of skin: Secondary | ICD-10-CM | POA: Diagnosis not present

## 2018-08-11 DIAGNOSIS — M170 Bilateral primary osteoarthritis of knee: Secondary | ICD-10-CM | POA: Diagnosis not present

## 2018-08-11 DIAGNOSIS — I872 Venous insufficiency (chronic) (peripheral): Secondary | ICD-10-CM | POA: Diagnosis not present

## 2018-08-11 DIAGNOSIS — E11621 Type 2 diabetes mellitus with foot ulcer: Secondary | ICD-10-CM | POA: Diagnosis not present

## 2018-08-14 DIAGNOSIS — E1122 Type 2 diabetes mellitus with diabetic chronic kidney disease: Secondary | ICD-10-CM | POA: Diagnosis not present

## 2018-08-14 DIAGNOSIS — E1151 Type 2 diabetes mellitus with diabetic peripheral angiopathy without gangrene: Secondary | ICD-10-CM | POA: Diagnosis not present

## 2018-08-14 DIAGNOSIS — E11621 Type 2 diabetes mellitus with foot ulcer: Secondary | ICD-10-CM | POA: Diagnosis not present

## 2018-08-14 DIAGNOSIS — L97521 Non-pressure chronic ulcer of other part of left foot limited to breakdown of skin: Secondary | ICD-10-CM | POA: Diagnosis not present

## 2018-08-14 DIAGNOSIS — I872 Venous insufficiency (chronic) (peripheral): Secondary | ICD-10-CM | POA: Diagnosis not present

## 2018-08-14 DIAGNOSIS — M170 Bilateral primary osteoarthritis of knee: Secondary | ICD-10-CM | POA: Diagnosis not present

## 2018-08-15 ENCOUNTER — Encounter: Payer: Self-pay | Admitting: Podiatry

## 2018-08-15 ENCOUNTER — Other Ambulatory Visit: Payer: Self-pay

## 2018-08-15 ENCOUNTER — Ambulatory Visit (INDEPENDENT_AMBULATORY_CARE_PROVIDER_SITE_OTHER): Payer: Medicare Other | Admitting: Podiatry

## 2018-08-15 VITALS — Temp 98.3°F

## 2018-08-15 DIAGNOSIS — M79674 Pain in right toe(s): Secondary | ICD-10-CM

## 2018-08-15 DIAGNOSIS — M79675 Pain in left toe(s): Secondary | ICD-10-CM

## 2018-08-15 DIAGNOSIS — E1151 Type 2 diabetes mellitus with diabetic peripheral angiopathy without gangrene: Secondary | ICD-10-CM | POA: Diagnosis not present

## 2018-08-15 DIAGNOSIS — L84 Corns and callosities: Secondary | ICD-10-CM

## 2018-08-15 DIAGNOSIS — B351 Tinea unguium: Secondary | ICD-10-CM | POA: Diagnosis not present

## 2018-08-15 NOTE — Progress Notes (Signed)
Patient ID: Valerie Garrison, female   DOB: 03/19/1916, 85 y.o.   MRN: [id number] HPI  Complaint:  Visit Type: Patient returns to my office for continued preventative foot care services. Complaint: Patient states" my nails have grown long and thick and become painful to walk and wear shoes" Patient has been diagnosed with DM with angiopathy. . She e presents for preventative foot care services. No changes to ROS.  She also has painful corns third toes right  . Patient has developed a heel callus right foot which is painful.   Podiatric Exam: Vascular: dorsalis pedis and posterior tibial pulses are negative. Capillary return is immediate. Temperature gradient is negative. Skin turgor WNL, bilateral swelling  Sensorium: Normal Semmes Weinstein monofilament test. Normal tactile sensation bilaterally.  Nail Exam: Pt has thick disfigured discolored nails with subungual debris noted bilateral entire nail hallux through fifth toenails Ulcer Exam: There is no evidence of ulcer or pre-ulcerative changes or infection. Orthopedic Exam: Muscle tone and strength are WNL. No limitations in general ROM. No crepitus or effusions noted. Foot type and digits show no abnormalities. Bony prominences are unremarkable. Severe HAV B/L.  Contracted digits  B/L Skin: No Porokeratosis. No infection or ulcers.  Distal clavi 3rd toes right foot..Callus posterior aspect right foot.  Diagnosis:  Tinea unguium, Pain in right toe, pain in left toes,  Callus/ corn B/L  Treatment & Plan Procedures and Treatment: Consent by patient was obtained for treatment procedures.  All questions were answered thoroughly reviewed. Debridement of mycotic and hypertrophic toenails, 1 through 5 bilateral and clearing of subungual debris. No ulceration, no infection noted. Debride corns/callus  Return Visit-Office Procedure: Patient instructed to return to the office for a follow up visit 10 weeks  for continued evaluation and  treatment.   Gardiner Barefoot DPM

## 2018-08-19 DIAGNOSIS — E1122 Type 2 diabetes mellitus with diabetic chronic kidney disease: Secondary | ICD-10-CM | POA: Diagnosis not present

## 2018-08-19 DIAGNOSIS — L97521 Non-pressure chronic ulcer of other part of left foot limited to breakdown of skin: Secondary | ICD-10-CM | POA: Diagnosis not present

## 2018-08-19 DIAGNOSIS — E11621 Type 2 diabetes mellitus with foot ulcer: Secondary | ICD-10-CM | POA: Diagnosis not present

## 2018-08-19 DIAGNOSIS — I872 Venous insufficiency (chronic) (peripheral): Secondary | ICD-10-CM | POA: Diagnosis not present

## 2018-08-19 DIAGNOSIS — M170 Bilateral primary osteoarthritis of knee: Secondary | ICD-10-CM | POA: Diagnosis not present

## 2018-08-19 DIAGNOSIS — E1151 Type 2 diabetes mellitus with diabetic peripheral angiopathy without gangrene: Secondary | ICD-10-CM | POA: Diagnosis not present

## 2018-08-20 DIAGNOSIS — G301 Alzheimer's disease with late onset: Secondary | ICD-10-CM | POA: Diagnosis not present

## 2018-08-20 DIAGNOSIS — E1151 Type 2 diabetes mellitus with diabetic peripheral angiopathy without gangrene: Secondary | ICD-10-CM | POA: Diagnosis not present

## 2018-08-20 DIAGNOSIS — N183 Chronic kidney disease, stage 3 (moderate): Secondary | ICD-10-CM | POA: Diagnosis not present

## 2018-08-20 DIAGNOSIS — F0280 Dementia in other diseases classified elsewhere without behavioral disturbance: Secondary | ICD-10-CM | POA: Diagnosis not present

## 2018-08-20 DIAGNOSIS — I129 Hypertensive chronic kidney disease with stage 1 through stage 4 chronic kidney disease, or unspecified chronic kidney disease: Secondary | ICD-10-CM | POA: Diagnosis not present

## 2018-08-20 DIAGNOSIS — E1122 Type 2 diabetes mellitus with diabetic chronic kidney disease: Secondary | ICD-10-CM | POA: Diagnosis not present

## 2018-08-21 DIAGNOSIS — L97521 Non-pressure chronic ulcer of other part of left foot limited to breakdown of skin: Secondary | ICD-10-CM | POA: Diagnosis not present

## 2018-08-21 DIAGNOSIS — I872 Venous insufficiency (chronic) (peripheral): Secondary | ICD-10-CM | POA: Diagnosis not present

## 2018-08-21 DIAGNOSIS — E1122 Type 2 diabetes mellitus with diabetic chronic kidney disease: Secondary | ICD-10-CM | POA: Diagnosis not present

## 2018-08-21 DIAGNOSIS — E1151 Type 2 diabetes mellitus with diabetic peripheral angiopathy without gangrene: Secondary | ICD-10-CM | POA: Diagnosis not present

## 2018-08-21 DIAGNOSIS — M170 Bilateral primary osteoarthritis of knee: Secondary | ICD-10-CM | POA: Diagnosis not present

## 2018-08-21 DIAGNOSIS — E11621 Type 2 diabetes mellitus with foot ulcer: Secondary | ICD-10-CM | POA: Diagnosis not present

## 2018-08-22 DIAGNOSIS — M170 Bilateral primary osteoarthritis of knee: Secondary | ICD-10-CM | POA: Diagnosis not present

## 2018-08-22 DIAGNOSIS — L97521 Non-pressure chronic ulcer of other part of left foot limited to breakdown of skin: Secondary | ICD-10-CM | POA: Diagnosis not present

## 2018-08-22 DIAGNOSIS — E1151 Type 2 diabetes mellitus with diabetic peripheral angiopathy without gangrene: Secondary | ICD-10-CM | POA: Diagnosis not present

## 2018-08-22 DIAGNOSIS — E11621 Type 2 diabetes mellitus with foot ulcer: Secondary | ICD-10-CM | POA: Diagnosis not present

## 2018-08-22 DIAGNOSIS — I872 Venous insufficiency (chronic) (peripheral): Secondary | ICD-10-CM | POA: Diagnosis not present

## 2018-08-22 DIAGNOSIS — E1122 Type 2 diabetes mellitus with diabetic chronic kidney disease: Secondary | ICD-10-CM | POA: Diagnosis not present

## 2018-08-24 DIAGNOSIS — M170 Bilateral primary osteoarthritis of knee: Secondary | ICD-10-CM | POA: Diagnosis not present

## 2018-08-24 DIAGNOSIS — I129 Hypertensive chronic kidney disease with stage 1 through stage 4 chronic kidney disease, or unspecified chronic kidney disease: Secondary | ICD-10-CM | POA: Diagnosis not present

## 2018-08-24 DIAGNOSIS — L97521 Non-pressure chronic ulcer of other part of left foot limited to breakdown of skin: Secondary | ICD-10-CM | POA: Diagnosis not present

## 2018-08-24 DIAGNOSIS — E1122 Type 2 diabetes mellitus with diabetic chronic kidney disease: Secondary | ICD-10-CM | POA: Diagnosis not present

## 2018-08-24 DIAGNOSIS — E1151 Type 2 diabetes mellitus with diabetic peripheral angiopathy without gangrene: Secondary | ICD-10-CM | POA: Diagnosis not present

## 2018-08-24 DIAGNOSIS — Z4800 Encounter for change or removal of nonsurgical wound dressing: Secondary | ICD-10-CM | POA: Diagnosis not present

## 2018-08-24 DIAGNOSIS — N183 Chronic kidney disease, stage 3 (moderate): Secondary | ICD-10-CM | POA: Diagnosis not present

## 2018-08-24 DIAGNOSIS — E11621 Type 2 diabetes mellitus with foot ulcer: Secondary | ICD-10-CM | POA: Diagnosis not present

## 2018-08-24 DIAGNOSIS — I872 Venous insufficiency (chronic) (peripheral): Secondary | ICD-10-CM | POA: Diagnosis not present

## 2018-08-26 DIAGNOSIS — E1151 Type 2 diabetes mellitus with diabetic peripheral angiopathy without gangrene: Secondary | ICD-10-CM | POA: Diagnosis not present

## 2018-08-26 DIAGNOSIS — I872 Venous insufficiency (chronic) (peripheral): Secondary | ICD-10-CM | POA: Diagnosis not present

## 2018-08-26 DIAGNOSIS — M170 Bilateral primary osteoarthritis of knee: Secondary | ICD-10-CM | POA: Diagnosis not present

## 2018-08-26 DIAGNOSIS — L97521 Non-pressure chronic ulcer of other part of left foot limited to breakdown of skin: Secondary | ICD-10-CM | POA: Diagnosis not present

## 2018-08-26 DIAGNOSIS — E1122 Type 2 diabetes mellitus with diabetic chronic kidney disease: Secondary | ICD-10-CM | POA: Diagnosis not present

## 2018-08-26 DIAGNOSIS — E11621 Type 2 diabetes mellitus with foot ulcer: Secondary | ICD-10-CM | POA: Diagnosis not present

## 2018-08-29 DIAGNOSIS — E1151 Type 2 diabetes mellitus with diabetic peripheral angiopathy without gangrene: Secondary | ICD-10-CM | POA: Diagnosis not present

## 2018-08-29 DIAGNOSIS — E1122 Type 2 diabetes mellitus with diabetic chronic kidney disease: Secondary | ICD-10-CM | POA: Diagnosis not present

## 2018-08-29 DIAGNOSIS — L97521 Non-pressure chronic ulcer of other part of left foot limited to breakdown of skin: Secondary | ICD-10-CM | POA: Diagnosis not present

## 2018-08-29 DIAGNOSIS — I872 Venous insufficiency (chronic) (peripheral): Secondary | ICD-10-CM | POA: Diagnosis not present

## 2018-08-29 DIAGNOSIS — E11621 Type 2 diabetes mellitus with foot ulcer: Secondary | ICD-10-CM | POA: Diagnosis not present

## 2018-08-29 DIAGNOSIS — M170 Bilateral primary osteoarthritis of knee: Secondary | ICD-10-CM | POA: Diagnosis not present

## 2018-09-01 DIAGNOSIS — E1151 Type 2 diabetes mellitus with diabetic peripheral angiopathy without gangrene: Secondary | ICD-10-CM | POA: Diagnosis not present

## 2018-09-01 DIAGNOSIS — L97521 Non-pressure chronic ulcer of other part of left foot limited to breakdown of skin: Secondary | ICD-10-CM | POA: Diagnosis not present

## 2018-09-01 DIAGNOSIS — I872 Venous insufficiency (chronic) (peripheral): Secondary | ICD-10-CM | POA: Diagnosis not present

## 2018-09-01 DIAGNOSIS — M170 Bilateral primary osteoarthritis of knee: Secondary | ICD-10-CM | POA: Diagnosis not present

## 2018-09-01 DIAGNOSIS — E11621 Type 2 diabetes mellitus with foot ulcer: Secondary | ICD-10-CM | POA: Diagnosis not present

## 2018-09-01 DIAGNOSIS — E1122 Type 2 diabetes mellitus with diabetic chronic kidney disease: Secondary | ICD-10-CM | POA: Diagnosis not present

## 2018-09-05 DIAGNOSIS — I872 Venous insufficiency (chronic) (peripheral): Secondary | ICD-10-CM | POA: Diagnosis not present

## 2018-09-05 DIAGNOSIS — M170 Bilateral primary osteoarthritis of knee: Secondary | ICD-10-CM | POA: Diagnosis not present

## 2018-09-05 DIAGNOSIS — L97521 Non-pressure chronic ulcer of other part of left foot limited to breakdown of skin: Secondary | ICD-10-CM | POA: Diagnosis not present

## 2018-09-05 DIAGNOSIS — E1151 Type 2 diabetes mellitus with diabetic peripheral angiopathy without gangrene: Secondary | ICD-10-CM | POA: Diagnosis not present

## 2018-09-05 DIAGNOSIS — E11621 Type 2 diabetes mellitus with foot ulcer: Secondary | ICD-10-CM | POA: Diagnosis not present

## 2018-09-05 DIAGNOSIS — E1122 Type 2 diabetes mellitus with diabetic chronic kidney disease: Secondary | ICD-10-CM | POA: Diagnosis not present

## 2018-09-10 DIAGNOSIS — E1122 Type 2 diabetes mellitus with diabetic chronic kidney disease: Secondary | ICD-10-CM | POA: Diagnosis not present

## 2018-09-10 DIAGNOSIS — M170 Bilateral primary osteoarthritis of knee: Secondary | ICD-10-CM | POA: Diagnosis not present

## 2018-09-10 DIAGNOSIS — E1151 Type 2 diabetes mellitus with diabetic peripheral angiopathy without gangrene: Secondary | ICD-10-CM | POA: Diagnosis not present

## 2018-09-10 DIAGNOSIS — I872 Venous insufficiency (chronic) (peripheral): Secondary | ICD-10-CM | POA: Diagnosis not present

## 2018-09-10 DIAGNOSIS — L97521 Non-pressure chronic ulcer of other part of left foot limited to breakdown of skin: Secondary | ICD-10-CM | POA: Diagnosis not present

## 2018-09-10 DIAGNOSIS — E11621 Type 2 diabetes mellitus with foot ulcer: Secondary | ICD-10-CM | POA: Diagnosis not present

## 2018-09-11 DIAGNOSIS — I872 Venous insufficiency (chronic) (peripheral): Secondary | ICD-10-CM | POA: Diagnosis not present

## 2018-09-11 DIAGNOSIS — L97521 Non-pressure chronic ulcer of other part of left foot limited to breakdown of skin: Secondary | ICD-10-CM | POA: Diagnosis not present

## 2018-09-11 DIAGNOSIS — M170 Bilateral primary osteoarthritis of knee: Secondary | ICD-10-CM | POA: Diagnosis not present

## 2018-09-11 DIAGNOSIS — E1122 Type 2 diabetes mellitus with diabetic chronic kidney disease: Secondary | ICD-10-CM | POA: Diagnosis not present

## 2018-09-11 DIAGNOSIS — E1151 Type 2 diabetes mellitus with diabetic peripheral angiopathy without gangrene: Secondary | ICD-10-CM | POA: Diagnosis not present

## 2018-09-11 DIAGNOSIS — E11621 Type 2 diabetes mellitus with foot ulcer: Secondary | ICD-10-CM | POA: Diagnosis not present

## 2018-09-22 DIAGNOSIS — E11621 Type 2 diabetes mellitus with foot ulcer: Secondary | ICD-10-CM | POA: Diagnosis not present

## 2018-09-22 DIAGNOSIS — E1151 Type 2 diabetes mellitus with diabetic peripheral angiopathy without gangrene: Secondary | ICD-10-CM | POA: Diagnosis not present

## 2018-09-22 DIAGNOSIS — E1122 Type 2 diabetes mellitus with diabetic chronic kidney disease: Secondary | ICD-10-CM | POA: Diagnosis not present

## 2018-09-22 DIAGNOSIS — I872 Venous insufficiency (chronic) (peripheral): Secondary | ICD-10-CM | POA: Diagnosis not present

## 2018-09-22 DIAGNOSIS — M170 Bilateral primary osteoarthritis of knee: Secondary | ICD-10-CM | POA: Diagnosis not present

## 2018-09-22 DIAGNOSIS — L97521 Non-pressure chronic ulcer of other part of left foot limited to breakdown of skin: Secondary | ICD-10-CM | POA: Diagnosis not present

## 2018-09-23 ENCOUNTER — Other Ambulatory Visit: Payer: Self-pay

## 2018-09-23 ENCOUNTER — Ambulatory Visit (INDEPENDENT_AMBULATORY_CARE_PROVIDER_SITE_OTHER): Payer: Medicare Other | Admitting: Podiatry

## 2018-09-23 ENCOUNTER — Encounter: Payer: Self-pay | Admitting: Podiatry

## 2018-09-23 DIAGNOSIS — E1122 Type 2 diabetes mellitus with diabetic chronic kidney disease: Secondary | ICD-10-CM | POA: Diagnosis not present

## 2018-09-23 DIAGNOSIS — E1151 Type 2 diabetes mellitus with diabetic peripheral angiopathy without gangrene: Secondary | ICD-10-CM | POA: Diagnosis not present

## 2018-09-23 DIAGNOSIS — M2012 Hallux valgus (acquired), left foot: Secondary | ICD-10-CM

## 2018-09-23 DIAGNOSIS — N183 Chronic kidney disease, stage 3 (moderate): Secondary | ICD-10-CM | POA: Diagnosis not present

## 2018-09-23 DIAGNOSIS — M170 Bilateral primary osteoarthritis of knee: Secondary | ICD-10-CM | POA: Diagnosis not present

## 2018-09-23 DIAGNOSIS — L97521 Non-pressure chronic ulcer of other part of left foot limited to breakdown of skin: Secondary | ICD-10-CM | POA: Diagnosis not present

## 2018-09-23 DIAGNOSIS — I872 Venous insufficiency (chronic) (peripheral): Secondary | ICD-10-CM | POA: Diagnosis not present

## 2018-09-23 DIAGNOSIS — L84 Corns and callosities: Secondary | ICD-10-CM

## 2018-09-23 DIAGNOSIS — E11621 Type 2 diabetes mellitus with foot ulcer: Secondary | ICD-10-CM | POA: Diagnosis not present

## 2018-09-23 DIAGNOSIS — Z4800 Encounter for change or removal of nonsurgical wound dressing: Secondary | ICD-10-CM | POA: Diagnosis not present

## 2018-09-23 DIAGNOSIS — I129 Hypertensive chronic kidney disease with stage 1 through stage 4 chronic kidney disease, or unspecified chronic kidney disease: Secondary | ICD-10-CM | POA: Diagnosis not present

## 2018-09-23 NOTE — Progress Notes (Signed)
Patient ID: Valerie Garrison, female   DOB: February 25, 1916, 85 y.o.   MRN: [id number] HPI  Complaint:  Visit Type: Patient returns to my office with chief complaint of a painful second toe left footl she presents to the office with her daughter.  She says her left toe has become blackened and painful for the last week and a half.  She is up applied Vaseline and alcohol to the toe but the problem persists.  She denies any drainage from the painful skin lesion second toe left foot.  Examination of the toe reveals right callus tissue noted at the inside  aspect of the second toe left foot.  She presents the office today for an evaluation and treatment.    Podiatric Exam: Vascular: dorsalis pedis and posterior tibial pulses are negative. Capillary return is immediate. Temperature gradient is negative. Skin turgor WNL, bilateral swelling  Sensorium: Normal Semmes Weinstein monofilament test. Normal tactile sensation bilaterally.  Nail Exam: Pt has thick disfigured discolored nails with subungual debris noted bilateral entire nail hallux through fifth toenails Ulcer Exam: There is no evidence of ulcer or pre-ulcerative changes or infection. Orthopedic Exam: Muscle tone and strength are WNL. No limitations in general ROM. No crepitus or effusions noted. Foot type and digits show no abnormalities. Bony prominences are unremarkable. Severe HAV B/L.  Contracted digits  B/L Skin: No Porokeratosis. No infection or ulcers.  Combination of white necrotic and macerated skin noted on the medial aspect of the second toe left foot.  This site is where the left hallux sits against the second toe left foot.  No redness or swelling noted.  There is black discoloration noted on the dorsal aspect of the second digit left foot due to the inflammation.  Diagnosis:  Skin lesion/preulcer second toe left foot.   Treatment & Plan Procedures and Treatment: Consent by patient was obtained for treatment procedures.  All questions were  answered thoroughly reviewed.  The skin lesion has developed due to the positioning of the great toe left foot sitting against the second toe left foot.  This is led to a painful pre-ulcerous area on the second toe left foot.  Due to her poor circulation she is not a candidate for cortisone injection to the toe due to the thin nature of her skin.  Therefore I dispensed gel padding to help separate the first from the second toe left foot.  Assured them that this black area is not gangrenous in nature.  Return to clinic as needed Return Visit-Office Procedure: Patient instructed to return to the office for a follow up visit 4 weeks  for continued evaluation and treatment.   Gardiner Barefoot DPM

## 2018-09-24 DIAGNOSIS — I872 Venous insufficiency (chronic) (peripheral): Secondary | ICD-10-CM | POA: Diagnosis not present

## 2018-09-24 DIAGNOSIS — E1122 Type 2 diabetes mellitus with diabetic chronic kidney disease: Secondary | ICD-10-CM | POA: Diagnosis not present

## 2018-09-24 DIAGNOSIS — E11621 Type 2 diabetes mellitus with foot ulcer: Secondary | ICD-10-CM | POA: Diagnosis not present

## 2018-09-24 DIAGNOSIS — M170 Bilateral primary osteoarthritis of knee: Secondary | ICD-10-CM | POA: Diagnosis not present

## 2018-09-24 DIAGNOSIS — E1151 Type 2 diabetes mellitus with diabetic peripheral angiopathy without gangrene: Secondary | ICD-10-CM | POA: Diagnosis not present

## 2018-09-24 DIAGNOSIS — L97521 Non-pressure chronic ulcer of other part of left foot limited to breakdown of skin: Secondary | ICD-10-CM | POA: Diagnosis not present

## 2018-09-26 DIAGNOSIS — I872 Venous insufficiency (chronic) (peripheral): Secondary | ICD-10-CM | POA: Diagnosis not present

## 2018-09-26 DIAGNOSIS — E1122 Type 2 diabetes mellitus with diabetic chronic kidney disease: Secondary | ICD-10-CM | POA: Diagnosis not present

## 2018-09-26 DIAGNOSIS — E1151 Type 2 diabetes mellitus with diabetic peripheral angiopathy without gangrene: Secondary | ICD-10-CM | POA: Diagnosis not present

## 2018-09-26 DIAGNOSIS — E11621 Type 2 diabetes mellitus with foot ulcer: Secondary | ICD-10-CM | POA: Diagnosis not present

## 2018-09-26 DIAGNOSIS — L97521 Non-pressure chronic ulcer of other part of left foot limited to breakdown of skin: Secondary | ICD-10-CM | POA: Diagnosis not present

## 2018-09-26 DIAGNOSIS — M170 Bilateral primary osteoarthritis of knee: Secondary | ICD-10-CM | POA: Diagnosis not present

## 2018-09-29 DIAGNOSIS — E11621 Type 2 diabetes mellitus with foot ulcer: Secondary | ICD-10-CM | POA: Diagnosis not present

## 2018-09-29 DIAGNOSIS — I872 Venous insufficiency (chronic) (peripheral): Secondary | ICD-10-CM | POA: Diagnosis not present

## 2018-09-29 DIAGNOSIS — E1151 Type 2 diabetes mellitus with diabetic peripheral angiopathy without gangrene: Secondary | ICD-10-CM | POA: Diagnosis not present

## 2018-09-29 DIAGNOSIS — L97521 Non-pressure chronic ulcer of other part of left foot limited to breakdown of skin: Secondary | ICD-10-CM | POA: Diagnosis not present

## 2018-09-29 DIAGNOSIS — E1122 Type 2 diabetes mellitus with diabetic chronic kidney disease: Secondary | ICD-10-CM | POA: Diagnosis not present

## 2018-09-29 DIAGNOSIS — M170 Bilateral primary osteoarthritis of knee: Secondary | ICD-10-CM | POA: Diagnosis not present

## 2018-09-30 DIAGNOSIS — I872 Venous insufficiency (chronic) (peripheral): Secondary | ICD-10-CM | POA: Diagnosis not present

## 2018-09-30 DIAGNOSIS — E11621 Type 2 diabetes mellitus with foot ulcer: Secondary | ICD-10-CM | POA: Diagnosis not present

## 2018-09-30 DIAGNOSIS — E1122 Type 2 diabetes mellitus with diabetic chronic kidney disease: Secondary | ICD-10-CM | POA: Diagnosis not present

## 2018-09-30 DIAGNOSIS — L97521 Non-pressure chronic ulcer of other part of left foot limited to breakdown of skin: Secondary | ICD-10-CM | POA: Diagnosis not present

## 2018-09-30 DIAGNOSIS — M170 Bilateral primary osteoarthritis of knee: Secondary | ICD-10-CM | POA: Diagnosis not present

## 2018-09-30 DIAGNOSIS — E1151 Type 2 diabetes mellitus with diabetic peripheral angiopathy without gangrene: Secondary | ICD-10-CM | POA: Diagnosis not present

## 2018-10-06 DIAGNOSIS — I872 Venous insufficiency (chronic) (peripheral): Secondary | ICD-10-CM | POA: Diagnosis not present

## 2018-10-06 DIAGNOSIS — E11621 Type 2 diabetes mellitus with foot ulcer: Secondary | ICD-10-CM | POA: Diagnosis not present

## 2018-10-06 DIAGNOSIS — M170 Bilateral primary osteoarthritis of knee: Secondary | ICD-10-CM | POA: Diagnosis not present

## 2018-10-06 DIAGNOSIS — E1151 Type 2 diabetes mellitus with diabetic peripheral angiopathy without gangrene: Secondary | ICD-10-CM | POA: Diagnosis not present

## 2018-10-06 DIAGNOSIS — E1122 Type 2 diabetes mellitus with diabetic chronic kidney disease: Secondary | ICD-10-CM | POA: Diagnosis not present

## 2018-10-06 DIAGNOSIS — L97521 Non-pressure chronic ulcer of other part of left foot limited to breakdown of skin: Secondary | ICD-10-CM | POA: Diagnosis not present

## 2018-10-17 DIAGNOSIS — E1122 Type 2 diabetes mellitus with diabetic chronic kidney disease: Secondary | ICD-10-CM | POA: Diagnosis not present

## 2018-10-17 DIAGNOSIS — Z23 Encounter for immunization: Secondary | ICD-10-CM | POA: Diagnosis not present

## 2018-10-17 DIAGNOSIS — I872 Venous insufficiency (chronic) (peripheral): Secondary | ICD-10-CM | POA: Diagnosis not present

## 2018-10-17 DIAGNOSIS — E11621 Type 2 diabetes mellitus with foot ulcer: Secondary | ICD-10-CM | POA: Diagnosis not present

## 2018-10-17 DIAGNOSIS — I129 Hypertensive chronic kidney disease with stage 1 through stage 4 chronic kidney disease, or unspecified chronic kidney disease: Secondary | ICD-10-CM | POA: Diagnosis not present

## 2018-10-17 DIAGNOSIS — N1831 Chronic kidney disease, stage 3a: Secondary | ICD-10-CM | POA: Diagnosis not present

## 2018-10-17 DIAGNOSIS — E1151 Type 2 diabetes mellitus with diabetic peripheral angiopathy without gangrene: Secondary | ICD-10-CM | POA: Diagnosis not present

## 2018-10-17 DIAGNOSIS — L97525 Non-pressure chronic ulcer of other part of left foot with muscle involvement without evidence of necrosis: Secondary | ICD-10-CM | POA: Diagnosis not present

## 2018-10-22 DIAGNOSIS — E1122 Type 2 diabetes mellitus with diabetic chronic kidney disease: Secondary | ICD-10-CM | POA: Diagnosis not present

## 2018-10-22 DIAGNOSIS — M170 Bilateral primary osteoarthritis of knee: Secondary | ICD-10-CM | POA: Diagnosis not present

## 2018-10-22 DIAGNOSIS — I872 Venous insufficiency (chronic) (peripheral): Secondary | ICD-10-CM | POA: Diagnosis not present

## 2018-10-22 DIAGNOSIS — E11621 Type 2 diabetes mellitus with foot ulcer: Secondary | ICD-10-CM | POA: Diagnosis not present

## 2018-10-22 DIAGNOSIS — E1151 Type 2 diabetes mellitus with diabetic peripheral angiopathy without gangrene: Secondary | ICD-10-CM | POA: Diagnosis not present

## 2018-10-22 DIAGNOSIS — L97521 Non-pressure chronic ulcer of other part of left foot limited to breakdown of skin: Secondary | ICD-10-CM | POA: Diagnosis not present

## 2018-10-31 ENCOUNTER — Ambulatory Visit (INDEPENDENT_AMBULATORY_CARE_PROVIDER_SITE_OTHER): Payer: Medicare Other | Admitting: Podiatry

## 2018-10-31 ENCOUNTER — Encounter: Payer: Self-pay | Admitting: Podiatry

## 2018-10-31 ENCOUNTER — Other Ambulatory Visit: Payer: Self-pay

## 2018-10-31 DIAGNOSIS — B351 Tinea unguium: Secondary | ICD-10-CM | POA: Diagnosis not present

## 2018-10-31 DIAGNOSIS — M79674 Pain in right toe(s): Secondary | ICD-10-CM

## 2018-10-31 DIAGNOSIS — M79675 Pain in left toe(s): Secondary | ICD-10-CM | POA: Diagnosis not present

## 2018-10-31 DIAGNOSIS — M2012 Hallux valgus (acquired), left foot: Secondary | ICD-10-CM | POA: Diagnosis not present

## 2018-10-31 DIAGNOSIS — E1151 Type 2 diabetes mellitus with diabetic peripheral angiopathy without gangrene: Secondary | ICD-10-CM

## 2018-10-31 DIAGNOSIS — M2042 Other hammer toe(s) (acquired), left foot: Secondary | ICD-10-CM | POA: Insufficient documentation

## 2018-10-31 NOTE — Progress Notes (Signed)
Patient ID: Valerie Garrison, female   DOB: [birth year], 85 y.o.   MRN: [id number] HPI  Complaint:  Visit Type: Patient returns to my office for continued preventative foot care services. Complaint: Patient states" my nails have grown long and thick and become painful to walk and wear shoes" Patient has been diagnosed with DM with angiopathy. . She  presents for preventative foot care services. No changes to ROS.  She is still experiencing pain from pressure area second toe left foot.   Podiatric Exam: Vascular: dorsalis pedis and posterior tibial pulses are negative. Capillary return is immediate. Temperature gradient is negative. Skin turgor WNL, bilateral swelling  Sensorium: Normal Semmes Weinstein monofilament test. Normal tactile sensation bilaterally.  Nail Exam: Pt has thick disfigured discolored nails with subungual debris noted bilateral entire nail hallux through fifth toenails Ulcer Exam: There is no evidence of ulcer or pre-ulcerative changes or infection. Orthopedic Exam: Muscle tone and strength are WNL. No limitations in general ROM. No crepitus or effusions noted. Foot type and digits show no abnormalities. Bony prominences are unremarkable. Severe HAV B/L.  Contracted digits  B/L Skin: No Porokeratosis. No infection or ulcers.    Diagnosis:  Tinea unguium, Pain in right toe, pain in left toes,    Treatment & Plan Procedures and Treatment: Consent by patient was obtained for treatment procedures.  All questions were answered thoroughly reviewed. Debridement of mycotic and hypertrophic toenails, 1 through 5 bilateral and clearing of subungual debris. No ulceration, no infection noted. Continue to separate second toe from first toe left foot with padding. Return Visit-Office Procedure: Patient instructed to return to the office for a follow up visit 10 weeks  for continued evaluation and treatment.   Gardiner Barefoot DPM

## 2019-01-16 ENCOUNTER — Ambulatory Visit (INDEPENDENT_AMBULATORY_CARE_PROVIDER_SITE_OTHER): Payer: Medicare Other | Admitting: Podiatry

## 2019-01-16 ENCOUNTER — Encounter: Payer: Self-pay | Admitting: Podiatry

## 2019-01-16 ENCOUNTER — Other Ambulatory Visit: Payer: Self-pay

## 2019-01-16 DIAGNOSIS — B351 Tinea unguium: Secondary | ICD-10-CM

## 2019-01-16 DIAGNOSIS — M2012 Hallux valgus (acquired), left foot: Secondary | ICD-10-CM | POA: Diagnosis not present

## 2019-01-16 DIAGNOSIS — M79674 Pain in right toe(s): Secondary | ICD-10-CM | POA: Diagnosis not present

## 2019-01-16 DIAGNOSIS — M79675 Pain in left toe(s): Secondary | ICD-10-CM | POA: Diagnosis not present

## 2019-01-16 DIAGNOSIS — E1151 Type 2 diabetes mellitus with diabetic peripheral angiopathy without gangrene: Secondary | ICD-10-CM

## 2019-01-16 NOTE — Progress Notes (Signed)
Patient ID: Valerie Garrison, female   DOB: [birth year], 86 y.o.   MRN: [id number] HPI  Complaint:  Visit Type: Patient returns to my office for continued preventative foot care services. Complaint: Patient states" my nails have grown long and thick and become painful to walk and wear shoes" Patient has been diagnosed with DM with angiopathy. . She  presents for preventative foot care services. No changes to ROS.  She is still experiencing pain from pressure area second toe left foot.   Podiatric Exam: Vascular: dorsalis pedis and posterior tibial pulses are negative. Capillary return is immediate. Temperature gradient is negative. Skin turgor WNL, bilateral swelling  Sensorium: Normal Semmes Weinstein monofilament test. Normal tactile sensation bilaterally.  Nail Exam: Pt has thick disfigured discolored nails with subungual debris noted bilateral entire nail hallux through fifth toenails Ulcer Exam: There is no evidence of ulcer or pre-ulcerative changes or infection. Orthopedic Exam: Muscle tone and strength are WNL. No limitations in general ROM. No crepitus or effusions noted. Foot type and digits show no abnormalities. Bony prominences are unremarkable. Severe HAV B/L.  Contracted digits  B/L Skin: No Porokeratosis. No infection or ulcers.    Diagnosis:  Tinea unguium, Pain in right toe, pain in left toes,    Treatment & Plan Procedures and Treatment: Consent by patient was obtained for treatment procedures.  All questions were answered thoroughly reviewed. Debridement of mycotic and hypertrophic toenails, 1 through 5 bilateral and clearing of subungual debris. No ulceration, no infection noted. Continue to separate second toe from first toe left foot with padding. Return Visit-Office Procedure: Patient instructed to return to the office for a follow up visit 10 weeks  for continued evaluation and treatment.   Gardiner Barefoot DPM

## 2019-02-17 DIAGNOSIS — E1122 Type 2 diabetes mellitus with diabetic chronic kidney disease: Secondary | ICD-10-CM | POA: Diagnosis not present

## 2019-02-17 DIAGNOSIS — Z79899 Other long term (current) drug therapy: Secondary | ICD-10-CM | POA: Diagnosis not present

## 2019-02-17 DIAGNOSIS — L97521 Non-pressure chronic ulcer of other part of left foot limited to breakdown of skin: Secondary | ICD-10-CM | POA: Diagnosis not present

## 2019-02-17 DIAGNOSIS — Z7189 Other specified counseling: Secondary | ICD-10-CM | POA: Diagnosis not present

## 2019-02-17 DIAGNOSIS — M170 Bilateral primary osteoarthritis of knee: Secondary | ICD-10-CM | POA: Diagnosis not present

## 2019-02-17 DIAGNOSIS — E1151 Type 2 diabetes mellitus with diabetic peripheral angiopathy without gangrene: Secondary | ICD-10-CM | POA: Diagnosis not present

## 2019-02-17 DIAGNOSIS — G301 Alzheimer's disease with late onset: Secondary | ICD-10-CM | POA: Diagnosis not present

## 2019-02-17 DIAGNOSIS — N1832 Chronic kidney disease, stage 3b: Secondary | ICD-10-CM | POA: Diagnosis not present

## 2019-02-17 DIAGNOSIS — I129 Hypertensive chronic kidney disease with stage 1 through stage 4 chronic kidney disease, or unspecified chronic kidney disease: Secondary | ICD-10-CM | POA: Diagnosis not present

## 2019-02-23 DIAGNOSIS — M170 Bilateral primary osteoarthritis of knee: Secondary | ICD-10-CM | POA: Diagnosis not present

## 2019-04-17 ENCOUNTER — Other Ambulatory Visit: Payer: Self-pay

## 2019-04-17 ENCOUNTER — Encounter: Payer: Self-pay | Admitting: Podiatry

## 2019-04-17 ENCOUNTER — Ambulatory Visit (INDEPENDENT_AMBULATORY_CARE_PROVIDER_SITE_OTHER): Payer: Medicare Other | Admitting: Podiatry

## 2019-04-17 DIAGNOSIS — B351 Tinea unguium: Secondary | ICD-10-CM | POA: Diagnosis not present

## 2019-04-17 DIAGNOSIS — E1151 Type 2 diabetes mellitus with diabetic peripheral angiopathy without gangrene: Secondary | ICD-10-CM

## 2019-04-17 DIAGNOSIS — M2012 Hallux valgus (acquired), left foot: Secondary | ICD-10-CM | POA: Diagnosis not present

## 2019-04-17 DIAGNOSIS — M79674 Pain in right toe(s): Secondary | ICD-10-CM

## 2019-04-17 DIAGNOSIS — M2042 Other hammer toe(s) (acquired), left foot: Secondary | ICD-10-CM

## 2019-04-17 DIAGNOSIS — L84 Corns and callosities: Secondary | ICD-10-CM | POA: Diagnosis not present

## 2019-04-17 DIAGNOSIS — M79675 Pain in left toe(s): Secondary | ICD-10-CM | POA: Diagnosis not present

## 2019-04-17 NOTE — Progress Notes (Signed)
This patient returns to my office for at risk foot care.  This patient requires this care by a professional since this patient will be at risk due to having chronic kidney disease and type 2 diabetes. This patient is unable to cut nails himself since the patient cannot reach his nails.These nails are painful walking and wearing shoes.  This patient presents for at risk foot care today.  General Appearance  Alert, conversant and in no acute stress.  Vascular  Dorsalis pedis and posterior tibial  pulses are palpable  bilaterally.  Capillary return is within normal limits  bilaterally. Temperature is within normal limits  bilaterally.  Neurologic  Senn-Weinstein monofilament wire test within normal limits  bilaterally. Muscle power within normal limits bilaterally.  Nails Thick disfigured discolored nails with subungual debris  from hallux to fifth toes bilaterally. No evidence of bacterial infection or drainage bilaterally.  Orthopedic  No limitations of motion  feet .  No crepitus or effusions noted.  No bony pathology or digital deformities noted.  Skin  normotropic skin with no porokeratosis noted bilaterally.  No signs of infections or ulcers noted.   Clavi 3rd digit right foot  Onychomycosis  Pain in right toes  Pain in left toes  Clavi 3rd toe right foot.  Consent was obtained for treatment procedures.   Mechanical debridement of nails 1-5  bilaterally performed with a nail nipper.  Filed with dremel without incident.  Debride clavi with # 15 blade.   Return office visit    3 months                 Told patient to return for periodic foot care and evaluation due to potential at risk complications.   Gardiner Barefoot DPM

## 2019-05-05 DIAGNOSIS — I878 Other specified disorders of veins: Secondary | ICD-10-CM | POA: Diagnosis not present

## 2019-05-05 DIAGNOSIS — E1122 Type 2 diabetes mellitus with diabetic chronic kidney disease: Secondary | ICD-10-CM | POA: Diagnosis not present

## 2019-05-05 DIAGNOSIS — N1832 Chronic kidney disease, stage 3b: Secondary | ICD-10-CM | POA: Diagnosis not present

## 2019-05-05 DIAGNOSIS — R269 Unspecified abnormalities of gait and mobility: Secondary | ICD-10-CM | POA: Diagnosis not present

## 2019-05-05 DIAGNOSIS — I129 Hypertensive chronic kidney disease with stage 1 through stage 4 chronic kidney disease, or unspecified chronic kidney disease: Secondary | ICD-10-CM | POA: Diagnosis not present

## 2019-05-07 DIAGNOSIS — G8929 Other chronic pain: Secondary | ICD-10-CM | POA: Diagnosis not present

## 2019-05-07 DIAGNOSIS — R296 Repeated falls: Secondary | ICD-10-CM | POA: Diagnosis not present

## 2019-05-07 DIAGNOSIS — R2689 Other abnormalities of gait and mobility: Secondary | ICD-10-CM | POA: Diagnosis not present

## 2019-05-07 DIAGNOSIS — I129 Hypertensive chronic kidney disease with stage 1 through stage 4 chronic kidney disease, or unspecified chronic kidney disease: Secondary | ICD-10-CM | POA: Diagnosis not present

## 2019-05-07 DIAGNOSIS — G301 Alzheimer's disease with late onset: Secondary | ICD-10-CM | POA: Diagnosis not present

## 2019-05-07 DIAGNOSIS — I872 Venous insufficiency (chronic) (peripheral): Secondary | ICD-10-CM | POA: Diagnosis not present

## 2019-05-07 DIAGNOSIS — M170 Bilateral primary osteoarthritis of knee: Secondary | ICD-10-CM | POA: Diagnosis not present

## 2019-05-07 DIAGNOSIS — F0280 Dementia in other diseases classified elsewhere without behavioral disturbance: Secondary | ICD-10-CM | POA: Diagnosis not present

## 2019-05-07 DIAGNOSIS — E1122 Type 2 diabetes mellitus with diabetic chronic kidney disease: Secondary | ICD-10-CM | POA: Diagnosis not present

## 2019-05-07 DIAGNOSIS — N1832 Chronic kidney disease, stage 3b: Secondary | ICD-10-CM | POA: Diagnosis not present

## 2019-05-07 DIAGNOSIS — E1151 Type 2 diabetes mellitus with diabetic peripheral angiopathy without gangrene: Secondary | ICD-10-CM | POA: Diagnosis not present

## 2019-05-12 DIAGNOSIS — E1122 Type 2 diabetes mellitus with diabetic chronic kidney disease: Secondary | ICD-10-CM | POA: Diagnosis not present

## 2019-05-12 DIAGNOSIS — R296 Repeated falls: Secondary | ICD-10-CM | POA: Diagnosis not present

## 2019-05-12 DIAGNOSIS — N1832 Chronic kidney disease, stage 3b: Secondary | ICD-10-CM | POA: Diagnosis not present

## 2019-05-12 DIAGNOSIS — I872 Venous insufficiency (chronic) (peripheral): Secondary | ICD-10-CM | POA: Diagnosis not present

## 2019-05-12 DIAGNOSIS — I129 Hypertensive chronic kidney disease with stage 1 through stage 4 chronic kidney disease, or unspecified chronic kidney disease: Secondary | ICD-10-CM | POA: Diagnosis not present

## 2019-05-12 DIAGNOSIS — R2689 Other abnormalities of gait and mobility: Secondary | ICD-10-CM | POA: Diagnosis not present

## 2019-05-14 DIAGNOSIS — I129 Hypertensive chronic kidney disease with stage 1 through stage 4 chronic kidney disease, or unspecified chronic kidney disease: Secondary | ICD-10-CM | POA: Diagnosis not present

## 2019-05-14 DIAGNOSIS — R296 Repeated falls: Secondary | ICD-10-CM | POA: Diagnosis not present

## 2019-05-14 DIAGNOSIS — E1122 Type 2 diabetes mellitus with diabetic chronic kidney disease: Secondary | ICD-10-CM | POA: Diagnosis not present

## 2019-05-14 DIAGNOSIS — I872 Venous insufficiency (chronic) (peripheral): Secondary | ICD-10-CM | POA: Diagnosis not present

## 2019-05-14 DIAGNOSIS — R2689 Other abnormalities of gait and mobility: Secondary | ICD-10-CM | POA: Diagnosis not present

## 2019-05-14 DIAGNOSIS — N1832 Chronic kidney disease, stage 3b: Secondary | ICD-10-CM | POA: Diagnosis not present

## 2019-05-19 DIAGNOSIS — I129 Hypertensive chronic kidney disease with stage 1 through stage 4 chronic kidney disease, or unspecified chronic kidney disease: Secondary | ICD-10-CM | POA: Diagnosis not present

## 2019-05-19 DIAGNOSIS — N1832 Chronic kidney disease, stage 3b: Secondary | ICD-10-CM | POA: Diagnosis not present

## 2019-05-19 DIAGNOSIS — R2689 Other abnormalities of gait and mobility: Secondary | ICD-10-CM | POA: Diagnosis not present

## 2019-05-19 DIAGNOSIS — E1122 Type 2 diabetes mellitus with diabetic chronic kidney disease: Secondary | ICD-10-CM | POA: Diagnosis not present

## 2019-05-19 DIAGNOSIS — I872 Venous insufficiency (chronic) (peripheral): Secondary | ICD-10-CM | POA: Diagnosis not present

## 2019-05-19 DIAGNOSIS — R296 Repeated falls: Secondary | ICD-10-CM | POA: Diagnosis not present

## 2019-05-21 DIAGNOSIS — N1832 Chronic kidney disease, stage 3b: Secondary | ICD-10-CM | POA: Diagnosis not present

## 2019-05-21 DIAGNOSIS — I129 Hypertensive chronic kidney disease with stage 1 through stage 4 chronic kidney disease, or unspecified chronic kidney disease: Secondary | ICD-10-CM | POA: Diagnosis not present

## 2019-05-21 DIAGNOSIS — R2689 Other abnormalities of gait and mobility: Secondary | ICD-10-CM | POA: Diagnosis not present

## 2019-05-21 DIAGNOSIS — I872 Venous insufficiency (chronic) (peripheral): Secondary | ICD-10-CM | POA: Diagnosis not present

## 2019-05-21 DIAGNOSIS — R296 Repeated falls: Secondary | ICD-10-CM | POA: Diagnosis not present

## 2019-05-21 DIAGNOSIS — E1122 Type 2 diabetes mellitus with diabetic chronic kidney disease: Secondary | ICD-10-CM | POA: Diagnosis not present

## 2019-05-26 DIAGNOSIS — R296 Repeated falls: Secondary | ICD-10-CM | POA: Diagnosis not present

## 2019-05-26 DIAGNOSIS — I872 Venous insufficiency (chronic) (peripheral): Secondary | ICD-10-CM | POA: Diagnosis not present

## 2019-05-26 DIAGNOSIS — I129 Hypertensive chronic kidney disease with stage 1 through stage 4 chronic kidney disease, or unspecified chronic kidney disease: Secondary | ICD-10-CM | POA: Diagnosis not present

## 2019-05-26 DIAGNOSIS — R2689 Other abnormalities of gait and mobility: Secondary | ICD-10-CM | POA: Diagnosis not present

## 2019-05-26 DIAGNOSIS — N1832 Chronic kidney disease, stage 3b: Secondary | ICD-10-CM | POA: Diagnosis not present

## 2019-05-26 DIAGNOSIS — E1122 Type 2 diabetes mellitus with diabetic chronic kidney disease: Secondary | ICD-10-CM | POA: Diagnosis not present

## 2019-05-28 DIAGNOSIS — I129 Hypertensive chronic kidney disease with stage 1 through stage 4 chronic kidney disease, or unspecified chronic kidney disease: Secondary | ICD-10-CM | POA: Diagnosis not present

## 2019-05-28 DIAGNOSIS — R2689 Other abnormalities of gait and mobility: Secondary | ICD-10-CM | POA: Diagnosis not present

## 2019-05-28 DIAGNOSIS — N1832 Chronic kidney disease, stage 3b: Secondary | ICD-10-CM | POA: Diagnosis not present

## 2019-05-28 DIAGNOSIS — E1122 Type 2 diabetes mellitus with diabetic chronic kidney disease: Secondary | ICD-10-CM | POA: Diagnosis not present

## 2019-05-28 DIAGNOSIS — I872 Venous insufficiency (chronic) (peripheral): Secondary | ICD-10-CM | POA: Diagnosis not present

## 2019-05-28 DIAGNOSIS — R296 Repeated falls: Secondary | ICD-10-CM | POA: Diagnosis not present

## 2019-06-02 DIAGNOSIS — R2689 Other abnormalities of gait and mobility: Secondary | ICD-10-CM | POA: Diagnosis not present

## 2019-06-02 DIAGNOSIS — I129 Hypertensive chronic kidney disease with stage 1 through stage 4 chronic kidney disease, or unspecified chronic kidney disease: Secondary | ICD-10-CM | POA: Diagnosis not present

## 2019-06-02 DIAGNOSIS — E1122 Type 2 diabetes mellitus with diabetic chronic kidney disease: Secondary | ICD-10-CM | POA: Diagnosis not present

## 2019-06-02 DIAGNOSIS — I872 Venous insufficiency (chronic) (peripheral): Secondary | ICD-10-CM | POA: Diagnosis not present

## 2019-06-02 DIAGNOSIS — R296 Repeated falls: Secondary | ICD-10-CM | POA: Diagnosis not present

## 2019-06-02 DIAGNOSIS — N1832 Chronic kidney disease, stage 3b: Secondary | ICD-10-CM | POA: Diagnosis not present

## 2019-06-04 DIAGNOSIS — E1122 Type 2 diabetes mellitus with diabetic chronic kidney disease: Secondary | ICD-10-CM | POA: Diagnosis not present

## 2019-06-04 DIAGNOSIS — N1832 Chronic kidney disease, stage 3b: Secondary | ICD-10-CM | POA: Diagnosis not present

## 2019-06-04 DIAGNOSIS — I129 Hypertensive chronic kidney disease with stage 1 through stage 4 chronic kidney disease, or unspecified chronic kidney disease: Secondary | ICD-10-CM | POA: Diagnosis not present

## 2019-06-04 DIAGNOSIS — R2689 Other abnormalities of gait and mobility: Secondary | ICD-10-CM | POA: Diagnosis not present

## 2019-06-04 DIAGNOSIS — R296 Repeated falls: Secondary | ICD-10-CM | POA: Diagnosis not present

## 2019-06-04 DIAGNOSIS — I872 Venous insufficiency (chronic) (peripheral): Secondary | ICD-10-CM | POA: Diagnosis not present

## 2019-06-06 DIAGNOSIS — I129 Hypertensive chronic kidney disease with stage 1 through stage 4 chronic kidney disease, or unspecified chronic kidney disease: Secondary | ICD-10-CM | POA: Diagnosis not present

## 2019-06-16 DIAGNOSIS — I129 Hypertensive chronic kidney disease with stage 1 through stage 4 chronic kidney disease, or unspecified chronic kidney disease: Secondary | ICD-10-CM | POA: Diagnosis not present

## 2019-06-16 DIAGNOSIS — G301 Alzheimer's disease with late onset: Secondary | ICD-10-CM | POA: Diagnosis not present

## 2019-06-16 DIAGNOSIS — F0280 Dementia in other diseases classified elsewhere without behavioral disturbance: Secondary | ICD-10-CM | POA: Diagnosis not present

## 2019-06-16 DIAGNOSIS — R634 Abnormal weight loss: Secondary | ICD-10-CM | POA: Diagnosis not present

## 2019-06-16 DIAGNOSIS — E1151 Type 2 diabetes mellitus with diabetic peripheral angiopathy without gangrene: Secondary | ICD-10-CM | POA: Diagnosis not present

## 2019-07-06 DIAGNOSIS — R2689 Other abnormalities of gait and mobility: Secondary | ICD-10-CM | POA: Diagnosis not present

## 2019-07-06 DIAGNOSIS — G8929 Other chronic pain: Secondary | ICD-10-CM | POA: Diagnosis not present

## 2019-07-06 DIAGNOSIS — M170 Bilateral primary osteoarthritis of knee: Secondary | ICD-10-CM | POA: Diagnosis not present

## 2019-07-06 DIAGNOSIS — I872 Venous insufficiency (chronic) (peripheral): Secondary | ICD-10-CM | POA: Diagnosis not present

## 2019-07-06 DIAGNOSIS — F0280 Dementia in other diseases classified elsewhere without behavioral disturbance: Secondary | ICD-10-CM | POA: Diagnosis not present

## 2019-07-06 DIAGNOSIS — I129 Hypertensive chronic kidney disease with stage 1 through stage 4 chronic kidney disease, or unspecified chronic kidney disease: Secondary | ICD-10-CM | POA: Diagnosis not present

## 2019-07-06 DIAGNOSIS — E1122 Type 2 diabetes mellitus with diabetic chronic kidney disease: Secondary | ICD-10-CM | POA: Diagnosis not present

## 2019-07-06 DIAGNOSIS — E1151 Type 2 diabetes mellitus with diabetic peripheral angiopathy without gangrene: Secondary | ICD-10-CM | POA: Diagnosis not present

## 2019-07-06 DIAGNOSIS — N1832 Chronic kidney disease, stage 3b: Secondary | ICD-10-CM | POA: Diagnosis not present

## 2019-07-06 DIAGNOSIS — Z87440 Personal history of urinary (tract) infections: Secondary | ICD-10-CM | POA: Diagnosis not present

## 2019-07-06 DIAGNOSIS — R296 Repeated falls: Secondary | ICD-10-CM | POA: Diagnosis not present

## 2019-07-06 DIAGNOSIS — G301 Alzheimer's disease with late onset: Secondary | ICD-10-CM | POA: Diagnosis not present

## 2019-07-07 DIAGNOSIS — E1122 Type 2 diabetes mellitus with diabetic chronic kidney disease: Secondary | ICD-10-CM | POA: Diagnosis not present

## 2019-07-07 DIAGNOSIS — N1832 Chronic kidney disease, stage 3b: Secondary | ICD-10-CM | POA: Diagnosis not present

## 2019-07-07 DIAGNOSIS — R296 Repeated falls: Secondary | ICD-10-CM | POA: Diagnosis not present

## 2019-07-07 DIAGNOSIS — R2689 Other abnormalities of gait and mobility: Secondary | ICD-10-CM | POA: Diagnosis not present

## 2019-07-07 DIAGNOSIS — I129 Hypertensive chronic kidney disease with stage 1 through stage 4 chronic kidney disease, or unspecified chronic kidney disease: Secondary | ICD-10-CM | POA: Diagnosis not present

## 2019-07-07 DIAGNOSIS — I872 Venous insufficiency (chronic) (peripheral): Secondary | ICD-10-CM | POA: Diagnosis not present

## 2019-07-09 DIAGNOSIS — R296 Repeated falls: Secondary | ICD-10-CM | POA: Diagnosis not present

## 2019-07-09 DIAGNOSIS — E1122 Type 2 diabetes mellitus with diabetic chronic kidney disease: Secondary | ICD-10-CM | POA: Diagnosis not present

## 2019-07-09 DIAGNOSIS — N1832 Chronic kidney disease, stage 3b: Secondary | ICD-10-CM | POA: Diagnosis not present

## 2019-07-09 DIAGNOSIS — R2689 Other abnormalities of gait and mobility: Secondary | ICD-10-CM | POA: Diagnosis not present

## 2019-07-09 DIAGNOSIS — I129 Hypertensive chronic kidney disease with stage 1 through stage 4 chronic kidney disease, or unspecified chronic kidney disease: Secondary | ICD-10-CM | POA: Diagnosis not present

## 2019-07-09 DIAGNOSIS — I872 Venous insufficiency (chronic) (peripheral): Secondary | ICD-10-CM | POA: Diagnosis not present

## 2019-07-16 DIAGNOSIS — R2689 Other abnormalities of gait and mobility: Secondary | ICD-10-CM | POA: Diagnosis not present

## 2019-07-16 DIAGNOSIS — R296 Repeated falls: Secondary | ICD-10-CM | POA: Diagnosis not present

## 2019-07-16 DIAGNOSIS — I872 Venous insufficiency (chronic) (peripheral): Secondary | ICD-10-CM | POA: Diagnosis not present

## 2019-07-16 DIAGNOSIS — I129 Hypertensive chronic kidney disease with stage 1 through stage 4 chronic kidney disease, or unspecified chronic kidney disease: Secondary | ICD-10-CM | POA: Diagnosis not present

## 2019-07-16 DIAGNOSIS — N1832 Chronic kidney disease, stage 3b: Secondary | ICD-10-CM | POA: Diagnosis not present

## 2019-07-16 DIAGNOSIS — E1122 Type 2 diabetes mellitus with diabetic chronic kidney disease: Secondary | ICD-10-CM | POA: Diagnosis not present

## 2019-07-17 ENCOUNTER — Encounter: Payer: Self-pay | Admitting: Podiatry

## 2019-07-17 ENCOUNTER — Other Ambulatory Visit: Payer: Self-pay

## 2019-07-17 ENCOUNTER — Ambulatory Visit (INDEPENDENT_AMBULATORY_CARE_PROVIDER_SITE_OTHER): Payer: Medicare Other | Admitting: Podiatry

## 2019-07-17 DIAGNOSIS — M79674 Pain in right toe(s): Secondary | ICD-10-CM | POA: Diagnosis not present

## 2019-07-17 DIAGNOSIS — M2012 Hallux valgus (acquired), left foot: Secondary | ICD-10-CM | POA: Diagnosis not present

## 2019-07-17 DIAGNOSIS — E1151 Type 2 diabetes mellitus with diabetic peripheral angiopathy without gangrene: Secondary | ICD-10-CM | POA: Diagnosis not present

## 2019-07-17 DIAGNOSIS — M79675 Pain in left toe(s): Secondary | ICD-10-CM | POA: Diagnosis not present

## 2019-07-17 DIAGNOSIS — L84 Corns and callosities: Secondary | ICD-10-CM

## 2019-07-17 DIAGNOSIS — B351 Tinea unguium: Secondary | ICD-10-CM

## 2019-07-17 NOTE — Progress Notes (Signed)
This patient returns to my office for at risk foot care.  This patient requires this care by a professional since this patient will be at risk due to having chronic kidney disease and type 2 diabetes.  This patient is unable to cut nails herself since the patient cannot reach her nails.These nails are painful walking and wearing shoes.  This patient presents for at risk foot care today.  General Appearance  Alert, conversant and in no acute stress.  Vascular  Dorsalis pedis and posterior tibial  pulses are palpable  bilaterally.  Capillary return is within normal limits  bilaterally. Temperature is within normal limits  bilaterally.  Neurologic  Senn-Weinstein monofilament wire test within normal limits  bilaterally. Muscle power within normal limits bilaterally.  Nails Thick disfigured discolored nails with subungual debris  from hallux to fifth toes bilaterally. No evidence of bacterial infection or drainage bilaterally.  Orthopedic  No limitations of motion  feet .  No crepitus or effusions noted.  No bony pathology or digital deformities noted.  Skin  normotropic skin with no porokeratosis noted bilaterally.  No signs of infections or ulcers noted.   Clavi 3rd digit right foot  Onychomycosis  Pain in right toes  Pain in left toes  Clavi 3rd toe right foot. Callus at dorsomedial exostosis right foot.  Consent was obtained for treatment procedures.   Mechanical debridement of nails 1-5  bilaterally performed with a nail nipper.  Filed with dremel without incident.  Debride clavi with # 15 blade. Debride callus with dremel tool.   Return office visit    3 months                 Told patient to return for periodic foot care and evaluation due to potential at risk complications.   Gardiner Barefoot DPM

## 2019-07-21 DIAGNOSIS — R296 Repeated falls: Secondary | ICD-10-CM | POA: Diagnosis not present

## 2019-07-21 DIAGNOSIS — I129 Hypertensive chronic kidney disease with stage 1 through stage 4 chronic kidney disease, or unspecified chronic kidney disease: Secondary | ICD-10-CM | POA: Diagnosis not present

## 2019-07-21 DIAGNOSIS — E1122 Type 2 diabetes mellitus with diabetic chronic kidney disease: Secondary | ICD-10-CM | POA: Diagnosis not present

## 2019-07-21 DIAGNOSIS — I872 Venous insufficiency (chronic) (peripheral): Secondary | ICD-10-CM | POA: Diagnosis not present

## 2019-07-21 DIAGNOSIS — N1832 Chronic kidney disease, stage 3b: Secondary | ICD-10-CM | POA: Diagnosis not present

## 2019-07-21 DIAGNOSIS — R2689 Other abnormalities of gait and mobility: Secondary | ICD-10-CM | POA: Diagnosis not present

## 2019-07-30 DIAGNOSIS — H04123 Dry eye syndrome of bilateral lacrimal glands: Secondary | ICD-10-CM | POA: Diagnosis not present

## 2019-07-30 DIAGNOSIS — H2513 Age-related nuclear cataract, bilateral: Secondary | ICD-10-CM | POA: Diagnosis not present

## 2019-07-30 DIAGNOSIS — R296 Repeated falls: Secondary | ICD-10-CM | POA: Diagnosis not present

## 2019-07-30 DIAGNOSIS — R2689 Other abnormalities of gait and mobility: Secondary | ICD-10-CM | POA: Diagnosis not present

## 2019-07-30 DIAGNOSIS — N1832 Chronic kidney disease, stage 3b: Secondary | ICD-10-CM | POA: Diagnosis not present

## 2019-07-30 DIAGNOSIS — I872 Venous insufficiency (chronic) (peripheral): Secondary | ICD-10-CM | POA: Diagnosis not present

## 2019-07-30 DIAGNOSIS — I129 Hypertensive chronic kidney disease with stage 1 through stage 4 chronic kidney disease, or unspecified chronic kidney disease: Secondary | ICD-10-CM | POA: Diagnosis not present

## 2019-07-30 DIAGNOSIS — E1122 Type 2 diabetes mellitus with diabetic chronic kidney disease: Secondary | ICD-10-CM | POA: Diagnosis not present

## 2019-07-30 DIAGNOSIS — E119 Type 2 diabetes mellitus without complications: Secondary | ICD-10-CM | POA: Diagnosis not present

## 2019-07-30 DIAGNOSIS — H02403 Unspecified ptosis of bilateral eyelids: Secondary | ICD-10-CM | POA: Diagnosis not present

## 2019-08-05 DIAGNOSIS — I129 Hypertensive chronic kidney disease with stage 1 through stage 4 chronic kidney disease, or unspecified chronic kidney disease: Secondary | ICD-10-CM | POA: Diagnosis not present

## 2019-10-23 ENCOUNTER — Ambulatory Visit: Payer: Medicare Other | Admitting: Podiatry

## 2019-10-27 DIAGNOSIS — E1122 Type 2 diabetes mellitus with diabetic chronic kidney disease: Secondary | ICD-10-CM | POA: Diagnosis not present

## 2019-10-27 DIAGNOSIS — Z23 Encounter for immunization: Secondary | ICD-10-CM | POA: Diagnosis not present

## 2019-10-27 DIAGNOSIS — I129 Hypertensive chronic kidney disease with stage 1 through stage 4 chronic kidney disease, or unspecified chronic kidney disease: Secondary | ICD-10-CM | POA: Diagnosis not present

## 2019-10-27 DIAGNOSIS — G301 Alzheimer's disease with late onset: Secondary | ICD-10-CM | POA: Diagnosis not present

## 2019-10-27 DIAGNOSIS — N1831 Chronic kidney disease, stage 3a: Secondary | ICD-10-CM | POA: Diagnosis not present

## 2019-10-27 DIAGNOSIS — E1151 Type 2 diabetes mellitus with diabetic peripheral angiopathy without gangrene: Secondary | ICD-10-CM | POA: Diagnosis not present

## 2019-10-27 DIAGNOSIS — F0280 Dementia in other diseases classified elsewhere without behavioral disturbance: Secondary | ICD-10-CM | POA: Diagnosis not present

## 2019-11-18 ENCOUNTER — Encounter: Payer: Self-pay | Admitting: Podiatry

## 2019-11-18 ENCOUNTER — Ambulatory Visit (INDEPENDENT_AMBULATORY_CARE_PROVIDER_SITE_OTHER): Payer: Medicare Other | Admitting: Podiatry

## 2019-11-18 ENCOUNTER — Other Ambulatory Visit: Payer: Self-pay

## 2019-11-18 DIAGNOSIS — B351 Tinea unguium: Secondary | ICD-10-CM | POA: Diagnosis not present

## 2019-11-18 DIAGNOSIS — M2012 Hallux valgus (acquired), left foot: Secondary | ICD-10-CM

## 2019-11-18 DIAGNOSIS — M79674 Pain in right toe(s): Secondary | ICD-10-CM

## 2019-11-18 DIAGNOSIS — M79675 Pain in left toe(s): Secondary | ICD-10-CM

## 2019-11-18 DIAGNOSIS — L84 Corns and callosities: Secondary | ICD-10-CM

## 2019-11-18 DIAGNOSIS — E1151 Type 2 diabetes mellitus with diabetic peripheral angiopathy without gangrene: Secondary | ICD-10-CM | POA: Diagnosis not present

## 2019-11-18 NOTE — Progress Notes (Signed)
This patient returns to my office for at risk foot care.  This patient requires this care by a professional since this patient will be at risk due to having chronic kidney disease and type 2 diabetes.  This patient is unable to cut nails herself since the patient cannot reach her nails.These nails are painful walking and wearing shoes.  This patient presents for at risk foot care today.  General Appearance  Alert, conversant and in no acute stress.  Vascular  Dorsalis pedis and posterior tibial  pulses are palpable  bilaterally.  Capillary return is within normal limits  bilaterally. Temperature is within normal limits  bilaterally.  Neurologic  Senn-Weinstein monofilament wire test within normal limits  bilaterally. Muscle power within normal limits bilaterally.  Nails Thick disfigured discolored nails with subungual debris  from hallux to fifth toes bilaterally. No evidence of bacterial infection or drainage bilaterally.  Orthopedic  No limitations of motion  feet .  No crepitus or effusions noted.  No bony pathology or digital deformities noted.  Skin  normotropic skin with no porokeratosis noted bilaterally.  No signs of infections or ulcers noted.   Clavi 3rd digit right foot  Onychomycosis  Pain in right toes  Pain in left toes  Clavi 3rd toe right foot. Callus at dorsomedial exostosis right foot.  Consent was obtained for treatment procedures.   Mechanical debridement of nails 1-5  bilaterally performed with a nail nipper.  Filed with dremel without incident.  Debride clavi with # 15 blade. Debride callus with dremel tool.   Return office visit    3 months                 Told patient to return for periodic foot care and evaluation due to potential at risk complications.   Gardiner Barefoot DPM

## 2019-11-21 DIAGNOSIS — Z23 Encounter for immunization: Secondary | ICD-10-CM | POA: Diagnosis not present

## 2020-02-09 DIAGNOSIS — I129 Hypertensive chronic kidney disease with stage 1 through stage 4 chronic kidney disease, or unspecified chronic kidney disease: Secondary | ICD-10-CM | POA: Diagnosis not present

## 2020-02-09 DIAGNOSIS — F0280 Dementia in other diseases classified elsewhere without behavioral disturbance: Secondary | ICD-10-CM | POA: Diagnosis not present

## 2020-02-09 DIAGNOSIS — N1831 Chronic kidney disease, stage 3a: Secondary | ICD-10-CM | POA: Diagnosis not present

## 2020-02-09 DIAGNOSIS — Z79899 Other long term (current) drug therapy: Secondary | ICD-10-CM | POA: Diagnosis not present

## 2020-02-09 DIAGNOSIS — E1122 Type 2 diabetes mellitus with diabetic chronic kidney disease: Secondary | ICD-10-CM | POA: Diagnosis not present

## 2020-02-09 DIAGNOSIS — G301 Alzheimer's disease with late onset: Secondary | ICD-10-CM | POA: Diagnosis not present

## 2020-02-09 DIAGNOSIS — Z1389 Encounter for screening for other disorder: Secondary | ICD-10-CM | POA: Diagnosis not present

## 2020-02-09 DIAGNOSIS — Z0000 Encounter for general adult medical examination without abnormal findings: Secondary | ICD-10-CM | POA: Diagnosis not present

## 2020-02-09 DIAGNOSIS — E1151 Type 2 diabetes mellitus with diabetic peripheral angiopathy without gangrene: Secondary | ICD-10-CM | POA: Diagnosis not present

## 2020-02-24 ENCOUNTER — Encounter: Payer: Self-pay | Admitting: Podiatry

## 2020-02-24 ENCOUNTER — Other Ambulatory Visit: Payer: Self-pay

## 2020-02-24 ENCOUNTER — Ambulatory Visit (INDEPENDENT_AMBULATORY_CARE_PROVIDER_SITE_OTHER): Payer: Medicare Other | Admitting: Podiatry

## 2020-02-24 DIAGNOSIS — M2012 Hallux valgus (acquired), left foot: Secondary | ICD-10-CM

## 2020-02-24 DIAGNOSIS — M79674 Pain in right toe(s): Secondary | ICD-10-CM | POA: Diagnosis not present

## 2020-02-24 DIAGNOSIS — L84 Corns and callosities: Secondary | ICD-10-CM

## 2020-02-24 DIAGNOSIS — B351 Tinea unguium: Secondary | ICD-10-CM | POA: Diagnosis not present

## 2020-02-24 DIAGNOSIS — E1151 Type 2 diabetes mellitus with diabetic peripheral angiopathy without gangrene: Secondary | ICD-10-CM

## 2020-02-24 DIAGNOSIS — M79675 Pain in left toe(s): Secondary | ICD-10-CM

## 2020-02-24 NOTE — Progress Notes (Signed)
This patient returns to my office for at risk foot care.  This patient requires this care by a professional since this patient will be at risk due to having chronic kidney disease and type 2 diabetes.  This patient is unable to cut nails herself since the patient cannot reach her nails.These nails are painful walking and wearing shoes.  This patient presents for at risk foot care today.  General Appearance  Alert, conversant and in no acute stress.  Vascular  Dorsalis pedis and posterior tibial  pulses are weakly  palpable  bilaterally.  Capillary return is diminished.  bilaterally. Cold feet bilaterally. Absent digital hair  B/L.  Neurologic  Senn-Weinstein monofilament wire test within normal limits  bilaterally. Muscle power within normal limits bilaterally.  Nails Thick disfigured discolored nails with subungual debris  from hallux to fifth toes bilaterally. No evidence of bacterial infection or drainage bilaterally.  Orthopedic  No limitations of motion  feet .  No crepitus or effusions noted.  No bony pathology or digital deformities noted.  Skin  normotropic skin with no porokeratosis noted bilaterally.  No signs of infections or ulcers noted.   Clavi 3rd digit right foot  Onychomycosis  Pain in right toes  Pain in left toes  Clavi 3rd toe right foot. Callus at dorsomedial exostosis right foot.  Consent was obtained for treatment procedures.   Mechanical debridement of nails 1-5  bilaterally performed with a nail nipper.  Filed with dremel without incident.  Debride clavi with # 15 blade. Debride callus with dremel tool.   Return office visit  10 weeks                Told patient to return for periodic foot care and evaluation due to potential at risk complications.   Gardiner Barefoot DPM

## 2020-05-04 ENCOUNTER — Ambulatory Visit (INDEPENDENT_AMBULATORY_CARE_PROVIDER_SITE_OTHER): Payer: Medicare Other | Admitting: Podiatry

## 2020-05-04 ENCOUNTER — Other Ambulatory Visit: Payer: Self-pay

## 2020-05-04 ENCOUNTER — Encounter: Payer: Self-pay | Admitting: Podiatry

## 2020-05-04 DIAGNOSIS — M79674 Pain in right toe(s): Secondary | ICD-10-CM | POA: Diagnosis not present

## 2020-05-04 DIAGNOSIS — E1151 Type 2 diabetes mellitus with diabetic peripheral angiopathy without gangrene: Secondary | ICD-10-CM | POA: Diagnosis not present

## 2020-05-04 DIAGNOSIS — M79675 Pain in left toe(s): Secondary | ICD-10-CM | POA: Diagnosis not present

## 2020-05-04 DIAGNOSIS — M2012 Hallux valgus (acquired), left foot: Secondary | ICD-10-CM

## 2020-05-04 DIAGNOSIS — B351 Tinea unguium: Secondary | ICD-10-CM | POA: Diagnosis not present

## 2020-05-04 NOTE — Progress Notes (Signed)
This patient returns to my office for at risk foot care.  This patient requires this care by a professional since this patient will be at risk due to having chronic kidney disease and type 2 diabetes.  This patient is unable to cut nails herself since the patient cannot reach her nails.These nails are painful walking and wearing shoes.  This patient presents for at risk foot care today.  General Appearance  Alert, conversant and in no acute stress.  Vascular  Dorsalis pedis and posterior tibial  pulses are weakly  palpable  bilaterally.  Capillary return is diminished.  bilaterally. Cold feet bilaterally. Absent digital hair  B/L.  Neurologic  Senn-Weinstein monofilament wire test within normal limits  bilaterally. Muscle power within normal limits bilaterally.  Nails Thick disfigured discolored nails with subungual debris  from hallux to fifth toes bilaterally. No evidence of bacterial infection or drainage bilaterally.  Orthopedic  No limitations of motion  feet .  No crepitus or effusions noted.  No bony pathology or digital deformities noted.  Skin  normotropic skin with no porokeratosis noted bilaterally.  No signs of infections or ulcers noted.   Clavi 3rd digit right foot resolved.  Onychomycosis  Pain in right toes  Pain in left toes  Clavi 3rd toe right foot. Callus at dorsomedial exostosis right foot resolved.  Consent was obtained for treatment procedures.   Mechanical debridement of nails 1-5  bilaterally performed with a nail nipper.  Filed with dremel without incident.     Return office visit  10 weeks                Told patient to return for periodic foot care and evaluation due to potential at risk complications.   Gardiner Barefoot DPM

## 2020-05-13 DIAGNOSIS — N390 Urinary tract infection, site not specified: Secondary | ICD-10-CM | POA: Diagnosis not present

## 2020-07-18 ENCOUNTER — Other Ambulatory Visit: Payer: Self-pay

## 2020-07-18 ENCOUNTER — Encounter: Payer: Self-pay | Admitting: Podiatry

## 2020-07-18 ENCOUNTER — Ambulatory Visit (INDEPENDENT_AMBULATORY_CARE_PROVIDER_SITE_OTHER): Payer: Medicare Other | Admitting: Podiatry

## 2020-07-18 DIAGNOSIS — M79675 Pain in left toe(s): Secondary | ICD-10-CM | POA: Diagnosis not present

## 2020-07-18 DIAGNOSIS — E1151 Type 2 diabetes mellitus with diabetic peripheral angiopathy without gangrene: Secondary | ICD-10-CM | POA: Diagnosis not present

## 2020-07-18 DIAGNOSIS — B351 Tinea unguium: Secondary | ICD-10-CM

## 2020-07-18 DIAGNOSIS — M79674 Pain in right toe(s): Secondary | ICD-10-CM

## 2020-07-18 DIAGNOSIS — M2012 Hallux valgus (acquired), left foot: Secondary | ICD-10-CM

## 2020-07-18 NOTE — Progress Notes (Addendum)
This patient returns to my office for at risk foot care.  This patient requires this care by a professional since this patient will be at risk due to having chronic kidney disease and type 2 diabetes.  This patient is unable to cut nails herself since the patient cannot reach her nails.These nails are painful walking and wearing shoes.  This patient presents for at risk foot care today.  General Appearance  Alert, conversant and in no acute stress.  Vascular  Dorsalis pedis and posterior tibial  pulses are weakly  palpable  bilaterally.  Capillary return is diminished.  bilaterally. Cold feet bilaterally. Absent digital hair  B/L.  Neurologic  Senn-Weinstein monofilament wire test within normal limits  bilaterally. Muscle power within normal limits bilaterally.  Nails Thick disfigured discolored nails with subungual debris  from hallux to fifth toes bilaterally. No evidence of bacterial infection or drainage bilaterally.  Orthopedic  No limitations of motion  feet .  No crepitus or effusions noted.  No bony pathology or digital deformities noted.  Skin  normotropic skin with no porokeratosis noted bilaterally.  No signs of infections or ulcers noted.   Clavi 3rd digit right foot resolved.  Onychomycosis  Pain in right toes  Pain in left toes  Clavi 3rd toe right foot. Callus at dorsomedial exostosis right foot resolved.  Consent was obtained for treatment procedures.   Mechanical debridement of nails 1-5  bilaterally performed with a nail nipper.  Filed with dremel without incident.  Padding 1/2 left and fifth toe right.   Return office visit  10 weeks                Told patient to return for periodic foot care and evaluation due to potential at risk complications.   Gardiner Barefoot DPM

## 2020-08-02 DIAGNOSIS — H02403 Unspecified ptosis of bilateral eyelids: Secondary | ICD-10-CM | POA: Diagnosis not present

## 2020-08-02 DIAGNOSIS — H2513 Age-related nuclear cataract, bilateral: Secondary | ICD-10-CM | POA: Diagnosis not present

## 2020-08-02 DIAGNOSIS — H04123 Dry eye syndrome of bilateral lacrimal glands: Secondary | ICD-10-CM | POA: Diagnosis not present

## 2020-08-02 DIAGNOSIS — E119 Type 2 diabetes mellitus without complications: Secondary | ICD-10-CM | POA: Diagnosis not present

## 2020-10-21 DIAGNOSIS — E1151 Type 2 diabetes mellitus with diabetic peripheral angiopathy without gangrene: Secondary | ICD-10-CM | POA: Diagnosis not present

## 2020-10-21 DIAGNOSIS — Z23 Encounter for immunization: Secondary | ICD-10-CM | POA: Diagnosis not present

## 2020-10-21 DIAGNOSIS — Z79899 Other long term (current) drug therapy: Secondary | ICD-10-CM | POA: Diagnosis not present

## 2020-10-21 DIAGNOSIS — N1831 Chronic kidney disease, stage 3a: Secondary | ICD-10-CM | POA: Diagnosis not present

## 2020-10-21 DIAGNOSIS — E1122 Type 2 diabetes mellitus with diabetic chronic kidney disease: Secondary | ICD-10-CM | POA: Diagnosis not present

## 2020-10-21 DIAGNOSIS — I129 Hypertensive chronic kidney disease with stage 1 through stage 4 chronic kidney disease, or unspecified chronic kidney disease: Secondary | ICD-10-CM | POA: Diagnosis not present

## 2020-10-21 DIAGNOSIS — D649 Anemia, unspecified: Secondary | ICD-10-CM | POA: Diagnosis not present

## 2020-10-24 ENCOUNTER — Other Ambulatory Visit: Payer: Self-pay

## 2020-10-24 ENCOUNTER — Ambulatory Visit (INDEPENDENT_AMBULATORY_CARE_PROVIDER_SITE_OTHER): Payer: Medicare Other | Admitting: Podiatry

## 2020-10-24 ENCOUNTER — Encounter: Payer: Self-pay | Admitting: Podiatry

## 2020-10-24 DIAGNOSIS — M79675 Pain in left toe(s): Secondary | ICD-10-CM

## 2020-10-24 DIAGNOSIS — M2042 Other hammer toe(s) (acquired), left foot: Secondary | ICD-10-CM

## 2020-10-24 DIAGNOSIS — M79674 Pain in right toe(s): Secondary | ICD-10-CM

## 2020-10-24 DIAGNOSIS — M2012 Hallux valgus (acquired), left foot: Secondary | ICD-10-CM

## 2020-10-24 DIAGNOSIS — B351 Tinea unguium: Secondary | ICD-10-CM

## 2020-10-24 DIAGNOSIS — E1151 Type 2 diabetes mellitus with diabetic peripheral angiopathy without gangrene: Secondary | ICD-10-CM

## 2020-10-24 NOTE — Progress Notes (Signed)
This patient returns to my office for at risk foot care.  This patient requires this care by a professional since this patient will be at risk due to having chronic kidney disease and type 2 diabetes.  This patient is unable to cut nails herself since the patient cannot reach her nails.These nails are painful walking and wearing shoes.  This patient presents for at risk foot care today.  General Appearance  Alert, conversant and in no acute stress.  Vascular  Dorsalis pedis and posterior tibial  pulses are weakly  palpable  bilaterally.  Capillary return is diminished.  bilaterally. Cold feet bilaterally. Absent digital hair  B/L.  Neurologic  Senn-Weinstein monofilament wire test within normal limits  bilaterally. Muscle power within normal limits bilaterally.  Nails Thick disfigured discolored nails with subungual debris  from hallux to fifth toes bilaterally. No evidence of bacterial infection or drainage bilaterally.  Orthopedic  No limitations of motion  feet .  No crepitus or effusions noted.  No bony pathology or digital deformities noted.  Skin  normotropic skin with no porokeratosis noted bilaterally.  No signs of infections or ulcers noted.   Clavi 3rd digit right foot resolved.  Onychomycosis  Pain in right toes  Pain in left toes  Clavi 3rd toe right foot. Callus at dorsomedial exostosis right foot resolved.  Consent was obtained for treatment procedures.   Mechanical debridement of nails 1-5  bilaterally performed with a nail nipper.  Filed with dremel without incident.  Padding 1/2 left and fifth toe right.   Return office visit  10 weeks                Told patient to return for periodic foot care and evaluation due to potential at risk complications.   Gardiner Barefoot DPM

## 2021-01-04 ENCOUNTER — Encounter: Payer: Self-pay | Admitting: Podiatry

## 2021-01-04 ENCOUNTER — Ambulatory Visit (INDEPENDENT_AMBULATORY_CARE_PROVIDER_SITE_OTHER): Payer: Medicare Other | Admitting: Podiatry

## 2021-01-04 ENCOUNTER — Other Ambulatory Visit: Payer: Self-pay

## 2021-01-04 DIAGNOSIS — B351 Tinea unguium: Secondary | ICD-10-CM

## 2021-01-04 DIAGNOSIS — M79674 Pain in right toe(s): Secondary | ICD-10-CM | POA: Diagnosis not present

## 2021-01-04 DIAGNOSIS — M79675 Pain in left toe(s): Secondary | ICD-10-CM | POA: Diagnosis not present

## 2021-01-04 DIAGNOSIS — M2042 Other hammer toe(s) (acquired), left foot: Secondary | ICD-10-CM | POA: Diagnosis not present

## 2021-01-04 DIAGNOSIS — M2012 Hallux valgus (acquired), left foot: Secondary | ICD-10-CM

## 2021-01-04 DIAGNOSIS — E1151 Type 2 diabetes mellitus with diabetic peripheral angiopathy without gangrene: Secondary | ICD-10-CM

## 2021-01-04 NOTE — Progress Notes (Signed)
This patient returns to my office for at risk foot care.  This patient requires this care by a professional since this patient will be at risk due to having chronic kidney disease and type 2 diabetes.  This patient is unable to cut nails herself since the patient cannot reach her nails.These nails are painful walking and wearing shoes.  This patient presents for at risk foot care today.  General Appearance  Alert, conversant and in no acute stress.  Vascular  Dorsalis pedis and posterior tibial  pulses are weakly  palpable  bilaterally.  Capillary return is diminished.  bilaterally. Cold feet bilaterally. Absent digital hair  B/L.  Neurologic  Senn-Weinstein monofilament wire test within normal limits  bilaterally. Muscle power within normal limits bilaterally.  Nails Thick disfigured discolored nails with subungual debris  from hallux to fifth toes bilaterally. No evidence of bacterial infection or drainage bilaterally.  Orthopedic  No limitations of motion  feet .  No crepitus or effusions noted.  No bony pathology or digital deformities noted.  Skin  normotropic skin with no porokeratosis noted bilaterally.  No signs of infections or ulcers noted.   Clavi 3rd digit right foot resolved.  Onychomycosis  Pain in right toes  Pain in left toes  Clavi 3rd toe right foot. Callus at dorsomedial exostosis right foot resolved.  Consent was obtained for treatment procedures.   Mechanical debridement of nails 1-5  bilaterally performed with a nail nipper.  Filed with dremel without incident.    Return office visit  10 weeks                Told patient to return for periodic foot care and evaluation due to potential at risk complications.   Gardiner Barefoot DPM

## 2021-01-24 DIAGNOSIS — R300 Dysuria: Secondary | ICD-10-CM | POA: Diagnosis not present

## 2021-02-06 DIAGNOSIS — L97921 Non-pressure chronic ulcer of unspecified part of left lower leg limited to breakdown of skin: Secondary | ICD-10-CM | POA: Diagnosis not present

## 2021-02-06 DIAGNOSIS — R600 Localized edema: Secondary | ICD-10-CM | POA: Diagnosis not present

## 2021-02-06 DIAGNOSIS — E1151 Type 2 diabetes mellitus with diabetic peripheral angiopathy without gangrene: Secondary | ICD-10-CM | POA: Diagnosis not present

## 2021-02-06 DIAGNOSIS — F0280 Dementia in other diseases classified elsewhere without behavioral disturbance: Secondary | ICD-10-CM | POA: Diagnosis not present

## 2021-02-06 DIAGNOSIS — N1831 Chronic kidney disease, stage 3a: Secondary | ICD-10-CM | POA: Diagnosis not present

## 2021-02-06 DIAGNOSIS — G301 Alzheimer's disease with late onset: Secondary | ICD-10-CM | POA: Diagnosis not present

## 2021-02-06 DIAGNOSIS — I129 Hypertensive chronic kidney disease with stage 1 through stage 4 chronic kidney disease, or unspecified chronic kidney disease: Secondary | ICD-10-CM | POA: Diagnosis not present

## 2021-02-06 DIAGNOSIS — E1122 Type 2 diabetes mellitus with diabetic chronic kidney disease: Secondary | ICD-10-CM | POA: Diagnosis not present

## 2021-02-09 DIAGNOSIS — N1831 Chronic kidney disease, stage 3a: Secondary | ICD-10-CM | POA: Diagnosis not present

## 2021-02-09 DIAGNOSIS — M170 Bilateral primary osteoarthritis of knee: Secondary | ICD-10-CM | POA: Diagnosis not present

## 2021-02-09 DIAGNOSIS — F0280 Dementia in other diseases classified elsewhere without behavioral disturbance: Secondary | ICD-10-CM | POA: Diagnosis not present

## 2021-02-09 DIAGNOSIS — R269 Unspecified abnormalities of gait and mobility: Secondary | ICD-10-CM | POA: Diagnosis not present

## 2021-02-09 DIAGNOSIS — I739 Peripheral vascular disease, unspecified: Secondary | ICD-10-CM | POA: Diagnosis not present

## 2021-02-09 DIAGNOSIS — E1122 Type 2 diabetes mellitus with diabetic chronic kidney disease: Secondary | ICD-10-CM | POA: Diagnosis not present

## 2021-02-09 DIAGNOSIS — L97921 Non-pressure chronic ulcer of unspecified part of left lower leg limited to breakdown of skin: Secondary | ICD-10-CM | POA: Diagnosis not present

## 2021-02-09 DIAGNOSIS — E1151 Type 2 diabetes mellitus with diabetic peripheral angiopathy without gangrene: Secondary | ICD-10-CM | POA: Diagnosis not present

## 2021-02-09 DIAGNOSIS — G301 Alzheimer's disease with late onset: Secondary | ICD-10-CM | POA: Diagnosis not present

## 2021-02-09 DIAGNOSIS — Z9181 History of falling: Secondary | ICD-10-CM | POA: Diagnosis not present

## 2021-02-09 DIAGNOSIS — I129 Hypertensive chronic kidney disease with stage 1 through stage 4 chronic kidney disease, or unspecified chronic kidney disease: Secondary | ICD-10-CM | POA: Diagnosis not present

## 2021-02-13 DIAGNOSIS — L97921 Non-pressure chronic ulcer of unspecified part of left lower leg limited to breakdown of skin: Secondary | ICD-10-CM | POA: Diagnosis not present

## 2021-02-13 DIAGNOSIS — G301 Alzheimer's disease with late onset: Secondary | ICD-10-CM | POA: Diagnosis not present

## 2021-02-13 DIAGNOSIS — I129 Hypertensive chronic kidney disease with stage 1 through stage 4 chronic kidney disease, or unspecified chronic kidney disease: Secondary | ICD-10-CM | POA: Diagnosis not present

## 2021-02-13 DIAGNOSIS — F0280 Dementia in other diseases classified elsewhere without behavioral disturbance: Secondary | ICD-10-CM | POA: Diagnosis not present

## 2021-02-13 DIAGNOSIS — E1151 Type 2 diabetes mellitus with diabetic peripheral angiopathy without gangrene: Secondary | ICD-10-CM | POA: Diagnosis not present

## 2021-02-13 DIAGNOSIS — I739 Peripheral vascular disease, unspecified: Secondary | ICD-10-CM | POA: Diagnosis not present

## 2021-02-16 DIAGNOSIS — I129 Hypertensive chronic kidney disease with stage 1 through stage 4 chronic kidney disease, or unspecified chronic kidney disease: Secondary | ICD-10-CM | POA: Diagnosis not present

## 2021-02-16 DIAGNOSIS — G301 Alzheimer's disease with late onset: Secondary | ICD-10-CM | POA: Diagnosis not present

## 2021-02-16 DIAGNOSIS — I739 Peripheral vascular disease, unspecified: Secondary | ICD-10-CM | POA: Diagnosis not present

## 2021-02-16 DIAGNOSIS — L97921 Non-pressure chronic ulcer of unspecified part of left lower leg limited to breakdown of skin: Secondary | ICD-10-CM | POA: Diagnosis not present

## 2021-02-16 DIAGNOSIS — E1151 Type 2 diabetes mellitus with diabetic peripheral angiopathy without gangrene: Secondary | ICD-10-CM | POA: Diagnosis not present

## 2021-02-16 DIAGNOSIS — F0280 Dementia in other diseases classified elsewhere without behavioral disturbance: Secondary | ICD-10-CM | POA: Diagnosis not present

## 2021-02-20 DIAGNOSIS — F0280 Dementia in other diseases classified elsewhere without behavioral disturbance: Secondary | ICD-10-CM | POA: Diagnosis not present

## 2021-02-20 DIAGNOSIS — G301 Alzheimer's disease with late onset: Secondary | ICD-10-CM | POA: Diagnosis not present

## 2021-02-20 DIAGNOSIS — I739 Peripheral vascular disease, unspecified: Secondary | ICD-10-CM | POA: Diagnosis not present

## 2021-02-20 DIAGNOSIS — E1151 Type 2 diabetes mellitus with diabetic peripheral angiopathy without gangrene: Secondary | ICD-10-CM | POA: Diagnosis not present

## 2021-02-20 DIAGNOSIS — L97921 Non-pressure chronic ulcer of unspecified part of left lower leg limited to breakdown of skin: Secondary | ICD-10-CM | POA: Diagnosis not present

## 2021-02-20 DIAGNOSIS — I129 Hypertensive chronic kidney disease with stage 1 through stage 4 chronic kidney disease, or unspecified chronic kidney disease: Secondary | ICD-10-CM | POA: Diagnosis not present

## 2021-02-22 DIAGNOSIS — G301 Alzheimer's disease with late onset: Secondary | ICD-10-CM | POA: Diagnosis not present

## 2021-02-22 DIAGNOSIS — E1151 Type 2 diabetes mellitus with diabetic peripheral angiopathy without gangrene: Secondary | ICD-10-CM | POA: Diagnosis not present

## 2021-02-22 DIAGNOSIS — L97921 Non-pressure chronic ulcer of unspecified part of left lower leg limited to breakdown of skin: Secondary | ICD-10-CM | POA: Diagnosis not present

## 2021-02-22 DIAGNOSIS — F0280 Dementia in other diseases classified elsewhere without behavioral disturbance: Secondary | ICD-10-CM | POA: Diagnosis not present

## 2021-02-22 DIAGNOSIS — I739 Peripheral vascular disease, unspecified: Secondary | ICD-10-CM | POA: Diagnosis not present

## 2021-02-22 DIAGNOSIS — I129 Hypertensive chronic kidney disease with stage 1 through stage 4 chronic kidney disease, or unspecified chronic kidney disease: Secondary | ICD-10-CM | POA: Diagnosis not present

## 2021-02-24 DIAGNOSIS — I739 Peripheral vascular disease, unspecified: Secondary | ICD-10-CM | POA: Diagnosis not present

## 2021-02-24 DIAGNOSIS — E1151 Type 2 diabetes mellitus with diabetic peripheral angiopathy without gangrene: Secondary | ICD-10-CM | POA: Diagnosis not present

## 2021-02-24 DIAGNOSIS — G301 Alzheimer's disease with late onset: Secondary | ICD-10-CM | POA: Diagnosis not present

## 2021-02-24 DIAGNOSIS — L97921 Non-pressure chronic ulcer of unspecified part of left lower leg limited to breakdown of skin: Secondary | ICD-10-CM | POA: Diagnosis not present

## 2021-02-24 DIAGNOSIS — F0280 Dementia in other diseases classified elsewhere without behavioral disturbance: Secondary | ICD-10-CM | POA: Diagnosis not present

## 2021-02-24 DIAGNOSIS — I129 Hypertensive chronic kidney disease with stage 1 through stage 4 chronic kidney disease, or unspecified chronic kidney disease: Secondary | ICD-10-CM | POA: Diagnosis not present

## 2021-02-27 DIAGNOSIS — L97921 Non-pressure chronic ulcer of unspecified part of left lower leg limited to breakdown of skin: Secondary | ICD-10-CM | POA: Diagnosis not present

## 2021-02-27 DIAGNOSIS — I129 Hypertensive chronic kidney disease with stage 1 through stage 4 chronic kidney disease, or unspecified chronic kidney disease: Secondary | ICD-10-CM | POA: Diagnosis not present

## 2021-02-27 DIAGNOSIS — G301 Alzheimer's disease with late onset: Secondary | ICD-10-CM | POA: Diagnosis not present

## 2021-02-27 DIAGNOSIS — F0280 Dementia in other diseases classified elsewhere without behavioral disturbance: Secondary | ICD-10-CM | POA: Diagnosis not present

## 2021-02-27 DIAGNOSIS — I739 Peripheral vascular disease, unspecified: Secondary | ICD-10-CM | POA: Diagnosis not present

## 2021-02-27 DIAGNOSIS — E1151 Type 2 diabetes mellitus with diabetic peripheral angiopathy without gangrene: Secondary | ICD-10-CM | POA: Diagnosis not present

## 2021-02-28 DIAGNOSIS — Z0000 Encounter for general adult medical examination without abnormal findings: Secondary | ICD-10-CM | POA: Diagnosis not present

## 2021-02-28 DIAGNOSIS — E1151 Type 2 diabetes mellitus with diabetic peripheral angiopathy without gangrene: Secondary | ICD-10-CM | POA: Diagnosis not present

## 2021-02-28 DIAGNOSIS — E1122 Type 2 diabetes mellitus with diabetic chronic kidney disease: Secondary | ICD-10-CM | POA: Diagnosis not present

## 2021-02-28 DIAGNOSIS — N1831 Chronic kidney disease, stage 3a: Secondary | ICD-10-CM | POA: Diagnosis not present

## 2021-02-28 DIAGNOSIS — Z79899 Other long term (current) drug therapy: Secondary | ICD-10-CM | POA: Diagnosis not present

## 2021-02-28 DIAGNOSIS — I129 Hypertensive chronic kidney disease with stage 1 through stage 4 chronic kidney disease, or unspecified chronic kidney disease: Secondary | ICD-10-CM | POA: Diagnosis not present

## 2021-02-28 DIAGNOSIS — Z7189 Other specified counseling: Secondary | ICD-10-CM | POA: Diagnosis not present

## 2021-02-28 DIAGNOSIS — R269 Unspecified abnormalities of gait and mobility: Secondary | ICD-10-CM | POA: Diagnosis not present

## 2021-03-03 DIAGNOSIS — E1151 Type 2 diabetes mellitus with diabetic peripheral angiopathy without gangrene: Secondary | ICD-10-CM | POA: Diagnosis not present

## 2021-03-03 DIAGNOSIS — G301 Alzheimer's disease with late onset: Secondary | ICD-10-CM | POA: Diagnosis not present

## 2021-03-03 DIAGNOSIS — F0280 Dementia in other diseases classified elsewhere without behavioral disturbance: Secondary | ICD-10-CM | POA: Diagnosis not present

## 2021-03-03 DIAGNOSIS — L97921 Non-pressure chronic ulcer of unspecified part of left lower leg limited to breakdown of skin: Secondary | ICD-10-CM | POA: Diagnosis not present

## 2021-03-03 DIAGNOSIS — I129 Hypertensive chronic kidney disease with stage 1 through stage 4 chronic kidney disease, or unspecified chronic kidney disease: Secondary | ICD-10-CM | POA: Diagnosis not present

## 2021-03-03 DIAGNOSIS — I739 Peripheral vascular disease, unspecified: Secondary | ICD-10-CM | POA: Diagnosis not present

## 2021-03-07 DIAGNOSIS — I739 Peripheral vascular disease, unspecified: Secondary | ICD-10-CM | POA: Diagnosis not present

## 2021-03-07 DIAGNOSIS — I129 Hypertensive chronic kidney disease with stage 1 through stage 4 chronic kidney disease, or unspecified chronic kidney disease: Secondary | ICD-10-CM | POA: Diagnosis not present

## 2021-03-07 DIAGNOSIS — G301 Alzheimer's disease with late onset: Secondary | ICD-10-CM | POA: Diagnosis not present

## 2021-03-07 DIAGNOSIS — F0280 Dementia in other diseases classified elsewhere without behavioral disturbance: Secondary | ICD-10-CM | POA: Diagnosis not present

## 2021-03-07 DIAGNOSIS — E1151 Type 2 diabetes mellitus with diabetic peripheral angiopathy without gangrene: Secondary | ICD-10-CM | POA: Diagnosis not present

## 2021-03-07 DIAGNOSIS — L97921 Non-pressure chronic ulcer of unspecified part of left lower leg limited to breakdown of skin: Secondary | ICD-10-CM | POA: Diagnosis not present

## 2021-03-08 DIAGNOSIS — F0280 Dementia in other diseases classified elsewhere without behavioral disturbance: Secondary | ICD-10-CM | POA: Diagnosis not present

## 2021-03-08 DIAGNOSIS — G301 Alzheimer's disease with late onset: Secondary | ICD-10-CM | POA: Diagnosis not present

## 2021-03-08 DIAGNOSIS — I129 Hypertensive chronic kidney disease with stage 1 through stage 4 chronic kidney disease, or unspecified chronic kidney disease: Secondary | ICD-10-CM | POA: Diagnosis not present

## 2021-03-08 DIAGNOSIS — E1151 Type 2 diabetes mellitus with diabetic peripheral angiopathy without gangrene: Secondary | ICD-10-CM | POA: Diagnosis not present

## 2021-03-08 DIAGNOSIS — I739 Peripheral vascular disease, unspecified: Secondary | ICD-10-CM | POA: Diagnosis not present

## 2021-03-08 DIAGNOSIS — L97921 Non-pressure chronic ulcer of unspecified part of left lower leg limited to breakdown of skin: Secondary | ICD-10-CM | POA: Diagnosis not present

## 2021-03-10 DIAGNOSIS — G301 Alzheimer's disease with late onset: Secondary | ICD-10-CM | POA: Diagnosis not present

## 2021-03-10 DIAGNOSIS — I739 Peripheral vascular disease, unspecified: Secondary | ICD-10-CM | POA: Diagnosis not present

## 2021-03-10 DIAGNOSIS — L97921 Non-pressure chronic ulcer of unspecified part of left lower leg limited to breakdown of skin: Secondary | ICD-10-CM | POA: Diagnosis not present

## 2021-03-10 DIAGNOSIS — E1151 Type 2 diabetes mellitus with diabetic peripheral angiopathy without gangrene: Secondary | ICD-10-CM | POA: Diagnosis not present

## 2021-03-10 DIAGNOSIS — F0280 Dementia in other diseases classified elsewhere without behavioral disturbance: Secondary | ICD-10-CM | POA: Diagnosis not present

## 2021-03-10 DIAGNOSIS — I129 Hypertensive chronic kidney disease with stage 1 through stage 4 chronic kidney disease, or unspecified chronic kidney disease: Secondary | ICD-10-CM | POA: Diagnosis not present

## 2021-03-11 DIAGNOSIS — R269 Unspecified abnormalities of gait and mobility: Secondary | ICD-10-CM | POA: Diagnosis not present

## 2021-03-11 DIAGNOSIS — G301 Alzheimer's disease with late onset: Secondary | ICD-10-CM | POA: Diagnosis not present

## 2021-03-11 DIAGNOSIS — N1831 Chronic kidney disease, stage 3a: Secondary | ICD-10-CM | POA: Diagnosis not present

## 2021-03-11 DIAGNOSIS — I739 Peripheral vascular disease, unspecified: Secondary | ICD-10-CM | POA: Diagnosis not present

## 2021-03-11 DIAGNOSIS — I129 Hypertensive chronic kidney disease with stage 1 through stage 4 chronic kidney disease, or unspecified chronic kidney disease: Secondary | ICD-10-CM | POA: Diagnosis not present

## 2021-03-11 DIAGNOSIS — E1151 Type 2 diabetes mellitus with diabetic peripheral angiopathy without gangrene: Secondary | ICD-10-CM | POA: Diagnosis not present

## 2021-03-11 DIAGNOSIS — F0280 Dementia in other diseases classified elsewhere without behavioral disturbance: Secondary | ICD-10-CM | POA: Diagnosis not present

## 2021-03-11 DIAGNOSIS — Z9181 History of falling: Secondary | ICD-10-CM | POA: Diagnosis not present

## 2021-03-11 DIAGNOSIS — L97921 Non-pressure chronic ulcer of unspecified part of left lower leg limited to breakdown of skin: Secondary | ICD-10-CM | POA: Diagnosis not present

## 2021-03-11 DIAGNOSIS — E1122 Type 2 diabetes mellitus with diabetic chronic kidney disease: Secondary | ICD-10-CM | POA: Diagnosis not present

## 2021-03-11 DIAGNOSIS — M170 Bilateral primary osteoarthritis of knee: Secondary | ICD-10-CM | POA: Diagnosis not present

## 2021-03-15 ENCOUNTER — Ambulatory Visit: Payer: Medicare Other | Admitting: Podiatry

## 2021-03-15 DIAGNOSIS — G301 Alzheimer's disease with late onset: Secondary | ICD-10-CM | POA: Diagnosis not present

## 2021-03-15 DIAGNOSIS — L97921 Non-pressure chronic ulcer of unspecified part of left lower leg limited to breakdown of skin: Secondary | ICD-10-CM | POA: Diagnosis not present

## 2021-03-15 DIAGNOSIS — E1151 Type 2 diabetes mellitus with diabetic peripheral angiopathy without gangrene: Secondary | ICD-10-CM | POA: Diagnosis not present

## 2021-03-15 DIAGNOSIS — I129 Hypertensive chronic kidney disease with stage 1 through stage 4 chronic kidney disease, or unspecified chronic kidney disease: Secondary | ICD-10-CM | POA: Diagnosis not present

## 2021-03-15 DIAGNOSIS — F0280 Dementia in other diseases classified elsewhere without behavioral disturbance: Secondary | ICD-10-CM | POA: Diagnosis not present

## 2021-03-15 DIAGNOSIS — I739 Peripheral vascular disease, unspecified: Secondary | ICD-10-CM | POA: Diagnosis not present

## 2021-03-16 DIAGNOSIS — L97921 Non-pressure chronic ulcer of unspecified part of left lower leg limited to breakdown of skin: Secondary | ICD-10-CM | POA: Diagnosis not present

## 2021-03-16 DIAGNOSIS — G301 Alzheimer's disease with late onset: Secondary | ICD-10-CM | POA: Diagnosis not present

## 2021-03-16 DIAGNOSIS — F0280 Dementia in other diseases classified elsewhere without behavioral disturbance: Secondary | ICD-10-CM | POA: Diagnosis not present

## 2021-03-16 DIAGNOSIS — E1151 Type 2 diabetes mellitus with diabetic peripheral angiopathy without gangrene: Secondary | ICD-10-CM | POA: Diagnosis not present

## 2021-03-16 DIAGNOSIS — I739 Peripheral vascular disease, unspecified: Secondary | ICD-10-CM | POA: Diagnosis not present

## 2021-03-16 DIAGNOSIS — I129 Hypertensive chronic kidney disease with stage 1 through stage 4 chronic kidney disease, or unspecified chronic kidney disease: Secondary | ICD-10-CM | POA: Diagnosis not present

## 2021-03-21 DIAGNOSIS — L97921 Non-pressure chronic ulcer of unspecified part of left lower leg limited to breakdown of skin: Secondary | ICD-10-CM | POA: Diagnosis not present

## 2021-03-21 DIAGNOSIS — I739 Peripheral vascular disease, unspecified: Secondary | ICD-10-CM | POA: Diagnosis not present

## 2021-03-21 DIAGNOSIS — I129 Hypertensive chronic kidney disease with stage 1 through stage 4 chronic kidney disease, or unspecified chronic kidney disease: Secondary | ICD-10-CM | POA: Diagnosis not present

## 2021-03-21 DIAGNOSIS — F0280 Dementia in other diseases classified elsewhere without behavioral disturbance: Secondary | ICD-10-CM | POA: Diagnosis not present

## 2021-03-21 DIAGNOSIS — E1151 Type 2 diabetes mellitus with diabetic peripheral angiopathy without gangrene: Secondary | ICD-10-CM | POA: Diagnosis not present

## 2021-03-21 DIAGNOSIS — G301 Alzheimer's disease with late onset: Secondary | ICD-10-CM | POA: Diagnosis not present

## 2021-03-23 DIAGNOSIS — I739 Peripheral vascular disease, unspecified: Secondary | ICD-10-CM | POA: Diagnosis not present

## 2021-03-23 DIAGNOSIS — I129 Hypertensive chronic kidney disease with stage 1 through stage 4 chronic kidney disease, or unspecified chronic kidney disease: Secondary | ICD-10-CM | POA: Diagnosis not present

## 2021-03-23 DIAGNOSIS — F0280 Dementia in other diseases classified elsewhere without behavioral disturbance: Secondary | ICD-10-CM | POA: Diagnosis not present

## 2021-03-23 DIAGNOSIS — L97921 Non-pressure chronic ulcer of unspecified part of left lower leg limited to breakdown of skin: Secondary | ICD-10-CM | POA: Diagnosis not present

## 2021-03-23 DIAGNOSIS — E1151 Type 2 diabetes mellitus with diabetic peripheral angiopathy without gangrene: Secondary | ICD-10-CM | POA: Diagnosis not present

## 2021-03-23 DIAGNOSIS — G301 Alzheimer's disease with late onset: Secondary | ICD-10-CM | POA: Diagnosis not present

## 2021-03-24 DIAGNOSIS — E1151 Type 2 diabetes mellitus with diabetic peripheral angiopathy without gangrene: Secondary | ICD-10-CM | POA: Diagnosis not present

## 2021-03-24 DIAGNOSIS — L97921 Non-pressure chronic ulcer of unspecified part of left lower leg limited to breakdown of skin: Secondary | ICD-10-CM | POA: Diagnosis not present

## 2021-03-24 DIAGNOSIS — G301 Alzheimer's disease with late onset: Secondary | ICD-10-CM | POA: Diagnosis not present

## 2021-03-24 DIAGNOSIS — F0280 Dementia in other diseases classified elsewhere without behavioral disturbance: Secondary | ICD-10-CM | POA: Diagnosis not present

## 2021-03-24 DIAGNOSIS — I129 Hypertensive chronic kidney disease with stage 1 through stage 4 chronic kidney disease, or unspecified chronic kidney disease: Secondary | ICD-10-CM | POA: Diagnosis not present

## 2021-03-24 DIAGNOSIS — I739 Peripheral vascular disease, unspecified: Secondary | ICD-10-CM | POA: Diagnosis not present

## 2021-03-28 ENCOUNTER — Encounter: Payer: Self-pay | Admitting: Podiatry

## 2021-03-28 ENCOUNTER — Ambulatory Visit (INDEPENDENT_AMBULATORY_CARE_PROVIDER_SITE_OTHER): Payer: Medicare Other | Admitting: Podiatry

## 2021-03-28 ENCOUNTER — Other Ambulatory Visit: Payer: Self-pay

## 2021-03-28 DIAGNOSIS — L84 Corns and callosities: Secondary | ICD-10-CM | POA: Diagnosis not present

## 2021-03-28 DIAGNOSIS — M79674 Pain in right toe(s): Secondary | ICD-10-CM | POA: Diagnosis not present

## 2021-03-28 DIAGNOSIS — B351 Tinea unguium: Secondary | ICD-10-CM

## 2021-03-28 DIAGNOSIS — M2012 Hallux valgus (acquired), left foot: Secondary | ICD-10-CM

## 2021-03-28 DIAGNOSIS — M21622 Bunionette of left foot: Secondary | ICD-10-CM

## 2021-03-28 DIAGNOSIS — E1151 Type 2 diabetes mellitus with diabetic peripheral angiopathy without gangrene: Secondary | ICD-10-CM

## 2021-03-28 DIAGNOSIS — M21621 Bunionette of right foot: Secondary | ICD-10-CM

## 2021-03-28 DIAGNOSIS — M79675 Pain in left toe(s): Secondary | ICD-10-CM | POA: Diagnosis not present

## 2021-03-28 NOTE — Progress Notes (Signed)
This patient returns to my office for at risk foot care.  This patient requires this care by a professional since this patient will be at risk due to having chronic kidney disease and type 2 diabetes.  This patient is unable to cut nails herself since the patient cannot reach her nails.These nails are painful walking and wearing shoes.  This patient presents for at risk foot care today with her daughter.  She has painful callus over the bony prominence on the outside left foot.  Patient denies drainage. ? ?General Appearance  Alert, conversant and in no acute stress. ? ?Vascular  Dorsalis pedis and posterior tibial  pulses are weakly  palpable  bilaterally.  Capillary return is diminished.  bilaterally. Cold feet bilaterally. Absent digital hair  B/L. ? ?Neurologic  Senn-Weinstein monofilament wire test within normal limits  bilaterally. Muscle power within normal limits bilaterally. ? ?Nails Thick disfigured discolored nails with subungual debris  from hallux to fifth toes bilaterally. No evidence of bacterial infection or drainage bilaterally. ? ?Orthopedic  No limitations of motion  feet .  No crepitus or effusions noted.  No bony pathology or digital deformities noted. ? ?Skin  normotropic skin with no porokeratosis noted bilaterally.  No signs of infections or ulcers noted.   Clavi 3rd digit right foot resolved. Necrotic tissue noted 5th MPJ left foot.   ? ?Onychomycosis  Pain in right toes  Pain in left toes  Clavi 3rd toe right foot. Callus at dorsomedial exostosis right foot resolved. ? ?Consent was obtained for treatment procedures.   Mechanical debridement of nails 1-5  bilaterally performed with a nail nipper.  Filed with dremel without incident. Debride callus 5th met left foot.  Told the daughter if the ulcer worsens she could see Dr.  Blenda Mounts in [place]. ? ? ?Return office visit  10 weeks                Told patient to return for periodic foot care and evaluation due to potential at risk  complications. ? ? ?Gardiner Barefoot DPM  ?

## 2021-03-31 DIAGNOSIS — I739 Peripheral vascular disease, unspecified: Secondary | ICD-10-CM | POA: Diagnosis not present

## 2021-03-31 DIAGNOSIS — I129 Hypertensive chronic kidney disease with stage 1 through stage 4 chronic kidney disease, or unspecified chronic kidney disease: Secondary | ICD-10-CM | POA: Diagnosis not present

## 2021-03-31 DIAGNOSIS — G301 Alzheimer's disease with late onset: Secondary | ICD-10-CM | POA: Diagnosis not present

## 2021-03-31 DIAGNOSIS — F0280 Dementia in other diseases classified elsewhere without behavioral disturbance: Secondary | ICD-10-CM | POA: Diagnosis not present

## 2021-03-31 DIAGNOSIS — L97921 Non-pressure chronic ulcer of unspecified part of left lower leg limited to breakdown of skin: Secondary | ICD-10-CM | POA: Diagnosis not present

## 2021-03-31 DIAGNOSIS — E1151 Type 2 diabetes mellitus with diabetic peripheral angiopathy without gangrene: Secondary | ICD-10-CM | POA: Diagnosis not present

## 2021-04-06 DIAGNOSIS — G301 Alzheimer's disease with late onset: Secondary | ICD-10-CM | POA: Diagnosis not present

## 2021-04-06 DIAGNOSIS — L97921 Non-pressure chronic ulcer of unspecified part of left lower leg limited to breakdown of skin: Secondary | ICD-10-CM | POA: Diagnosis not present

## 2021-04-06 DIAGNOSIS — E1151 Type 2 diabetes mellitus with diabetic peripheral angiopathy without gangrene: Secondary | ICD-10-CM | POA: Diagnosis not present

## 2021-04-06 DIAGNOSIS — I739 Peripheral vascular disease, unspecified: Secondary | ICD-10-CM | POA: Diagnosis not present

## 2021-04-06 DIAGNOSIS — F0280 Dementia in other diseases classified elsewhere without behavioral disturbance: Secondary | ICD-10-CM | POA: Diagnosis not present

## 2021-04-06 DIAGNOSIS — I129 Hypertensive chronic kidney disease with stage 1 through stage 4 chronic kidney disease, or unspecified chronic kidney disease: Secondary | ICD-10-CM | POA: Diagnosis not present

## 2021-05-23 DIAGNOSIS — R399 Unspecified symptoms and signs involving the genitourinary system: Secondary | ICD-10-CM | POA: Diagnosis not present

## 2021-06-07 ENCOUNTER — Ambulatory Visit: Payer: Medicare Other | Admitting: Podiatry

## 2021-06-19 ENCOUNTER — Encounter: Payer: Self-pay | Admitting: Podiatry

## 2021-06-19 ENCOUNTER — Ambulatory Visit (INDEPENDENT_AMBULATORY_CARE_PROVIDER_SITE_OTHER): Payer: Medicare Other | Admitting: Podiatry

## 2021-06-19 DIAGNOSIS — E1151 Type 2 diabetes mellitus with diabetic peripheral angiopathy without gangrene: Secondary | ICD-10-CM

## 2021-06-19 DIAGNOSIS — M79674 Pain in right toe(s): Secondary | ICD-10-CM

## 2021-06-19 DIAGNOSIS — M2012 Hallux valgus (acquired), left foot: Secondary | ICD-10-CM

## 2021-06-19 DIAGNOSIS — B351 Tinea unguium: Secondary | ICD-10-CM | POA: Diagnosis not present

## 2021-06-19 DIAGNOSIS — L84 Corns and callosities: Secondary | ICD-10-CM

## 2021-06-19 DIAGNOSIS — M79675 Pain in left toe(s): Secondary | ICD-10-CM

## 2021-06-19 DIAGNOSIS — M21621 Bunionette of right foot: Secondary | ICD-10-CM

## 2021-06-19 NOTE — Progress Notes (Signed)
This patient returns to my office for at risk foot care.  This patient requires this care by a professional since this patient will be at risk due to having chronic kidney disease and type 2 diabetes.  This patient is unable to cut nails herself since the patient cannot reach her nails.These nails are painful walking and wearing shoes.  This patient presents for at risk foot care today with her son.  She has painful callus over the bony prominence on the outside left foot.  Patient denies drainage.  General Appearance  Alert, conversant and in no acute stress.  Vascular  Dorsalis pedis and posterior tibial  pulses are weakly  palpable  bilaterally.  Capillary return is diminished.  bilaterally. Cold feet bilaterally. Absent digital hair  B/L.  Neurologic  Senn-Weinstein monofilament wire test within normal limits  bilaterally. Muscle power within normal limits bilaterally.  Nails Thick disfigured discolored nails with subungual debris  from hallux to fifth toes bilaterally. No evidence of bacterial infection or drainage bilaterally.  Orthopedic  No limitations of motion  feet .  No crepitus or effusions noted.  No bony pathology or digital deformities noted.  Skin  normotropic skin with no porokeratosis noted bilaterally.  No signs of infections or ulcers noted.   Clavi 3rd digit right foot resolved. Necrotic tissue noted 5th MPJ left foot.    Onychomycosis  Pain in right toes  Pain in left toes   Callus at dorsomedial exostosis right foot.  Callus over 5th MPJ left foot.  Consent was obtained for treatment procedures.   Mechanical debridement of nails 1-5  bilaterally performed with a nail nipper.  Filed with dremel without incident. Debride callus 5th met left foot.   Return office visit  12 weeks                Told patient to return for periodic foot care and evaluation due to potential at risk complications.   Gardiner Barefoot DPM

## 2021-06-23 DIAGNOSIS — R399 Unspecified symptoms and signs involving the genitourinary system: Secondary | ICD-10-CM | POA: Diagnosis not present

## 2021-07-04 DIAGNOSIS — E1121 Type 2 diabetes mellitus with diabetic nephropathy: Secondary | ICD-10-CM | POA: Diagnosis not present

## 2021-07-04 DIAGNOSIS — I129 Hypertensive chronic kidney disease with stage 1 through stage 4 chronic kidney disease, or unspecified chronic kidney disease: Secondary | ICD-10-CM | POA: Diagnosis not present

## 2021-07-04 DIAGNOSIS — N1831 Chronic kidney disease, stage 3a: Secondary | ICD-10-CM | POA: Diagnosis not present

## 2021-07-04 DIAGNOSIS — F0280 Dementia in other diseases classified elsewhere without behavioral disturbance: Secondary | ICD-10-CM | POA: Diagnosis not present

## 2021-08-02 DIAGNOSIS — H02403 Unspecified ptosis of bilateral eyelids: Secondary | ICD-10-CM | POA: Diagnosis not present

## 2021-08-02 DIAGNOSIS — E119 Type 2 diabetes mellitus without complications: Secondary | ICD-10-CM | POA: Diagnosis not present

## 2021-08-02 DIAGNOSIS — H2513 Age-related nuclear cataract, bilateral: Secondary | ICD-10-CM | POA: Diagnosis not present

## 2021-08-02 DIAGNOSIS — H04123 Dry eye syndrome of bilateral lacrimal glands: Secondary | ICD-10-CM | POA: Diagnosis not present

## 2021-09-19 ENCOUNTER — Ambulatory Visit: Payer: Medicare Other | Admitting: Podiatry

## 2021-10-06 ENCOUNTER — Ambulatory Visit (INDEPENDENT_AMBULATORY_CARE_PROVIDER_SITE_OTHER): Payer: Medicare Other | Admitting: Podiatry

## 2021-10-06 ENCOUNTER — Encounter: Payer: Self-pay | Admitting: Podiatry

## 2021-10-06 DIAGNOSIS — M21621 Bunionette of right foot: Secondary | ICD-10-CM | POA: Diagnosis not present

## 2021-10-06 DIAGNOSIS — B351 Tinea unguium: Secondary | ICD-10-CM

## 2021-10-06 DIAGNOSIS — M2042 Other hammer toe(s) (acquired), left foot: Secondary | ICD-10-CM | POA: Diagnosis not present

## 2021-10-06 DIAGNOSIS — M2012 Hallux valgus (acquired), left foot: Secondary | ICD-10-CM

## 2021-10-06 DIAGNOSIS — M21622 Bunionette of left foot: Secondary | ICD-10-CM | POA: Diagnosis not present

## 2021-10-06 DIAGNOSIS — E1151 Type 2 diabetes mellitus with diabetic peripheral angiopathy without gangrene: Secondary | ICD-10-CM

## 2021-10-06 DIAGNOSIS — M79675 Pain in left toe(s): Secondary | ICD-10-CM | POA: Diagnosis not present

## 2021-10-06 DIAGNOSIS — L84 Corns and callosities: Secondary | ICD-10-CM

## 2021-10-06 DIAGNOSIS — M79674 Pain in right toe(s): Secondary | ICD-10-CM | POA: Diagnosis not present

## 2021-10-06 NOTE — Progress Notes (Signed)
This patient returns to my office for at risk foot care.  This patient requires this care by a professional since this patient will be at risk due to having chronic kidney disease and type 2 diabetes.  This patient is unable to cut nails herself since the patient cannot reach her nails.These nails are painful walking and wearing shoes.  This patient presents for at risk foot care today with her son.  She has painful callus over the bony prominence on the outside left foot.  Patient denies drainage.  General Appearance  Alert, conversant and in no acute stress.  Vascular  Dorsalis pedis and posterior tibial  pulses are weakly  palpable  bilaterally.  Capillary return is diminished.  bilaterally. Cold feet bilaterally. Absent digital hair  B/L.  Neurologic  Senn-Weinstein monofilament wire test within normal limits  bilaterally. Muscle power within normal limits bilaterally.  Nails Thick disfigured discolored nails with subungual debris  from hallux to fifth toes bilaterally. No evidence of bacterial infection or drainage bilaterally.  Orthopedic  No limitations of motion  feet .  No crepitus or effusions noted.  No bony pathology or digital deformities noted.  Skin  normotropic skin with no porokeratosis noted bilaterally.  No signs of infections or ulcers noted.   Clavi 3rd digit right foot resolved. Necrotic tissue noted 5th MPJ left foot.    Onychomycosis  Pain in right toes  Pain in left toes   Callus at dorsomedial exostosis right foot.  Callus over 5th MPJ left foot.  Consent was obtained for treatment procedures.   Mechanical debridement of nails 1-5  bilaterally performed with a nail nipper.  Filed with dremel without incident. Debride callus 5th met left foot  and over  the dorsomedial exostosis 1st MPJ right foot.Debrided with dremel tool.   Return office visit  12 weeks                Told patient to return for periodic foot care and evaluation due to potential at risk  complications.   Gardiner Barefoot DPM

## 2021-10-14 DIAGNOSIS — Z23 Encounter for immunization: Secondary | ICD-10-CM | POA: Diagnosis not present

## 2021-10-15 DIAGNOSIS — R531 Weakness: Secondary | ICD-10-CM | POA: Diagnosis not present

## 2021-10-15 DIAGNOSIS — R5383 Other fatigue: Secondary | ICD-10-CM | POA: Diagnosis not present

## 2021-10-15 DIAGNOSIS — I4510 Unspecified right bundle-branch block: Secondary | ICD-10-CM | POA: Diagnosis not present

## 2021-10-15 DIAGNOSIS — Z1152 Encounter for screening for COVID-19: Secondary | ICD-10-CM | POA: Diagnosis not present

## 2021-10-15 DIAGNOSIS — I7090 Unspecified atherosclerosis: Secondary | ICD-10-CM | POA: Diagnosis not present

## 2021-10-15 DIAGNOSIS — I517 Cardiomegaly: Secondary | ICD-10-CM | POA: Diagnosis not present

## 2021-10-15 DIAGNOSIS — R55 Syncope and collapse: Secondary | ICD-10-CM | POA: Diagnosis not present

## 2021-10-15 DIAGNOSIS — I959 Hypotension, unspecified: Secondary | ICD-10-CM | POA: Diagnosis not present

## 2021-10-24 DIAGNOSIS — I129 Hypertensive chronic kidney disease with stage 1 through stage 4 chronic kidney disease, or unspecified chronic kidney disease: Secondary | ICD-10-CM | POA: Diagnosis not present

## 2021-10-24 DIAGNOSIS — R262 Difficulty in walking, not elsewhere classified: Secondary | ICD-10-CM | POA: Diagnosis not present

## 2021-10-24 DIAGNOSIS — N179 Acute kidney failure, unspecified: Secondary | ICD-10-CM | POA: Diagnosis not present

## 2021-11-21 DIAGNOSIS — Z9181 History of falling: Secondary | ICD-10-CM | POA: Diagnosis not present

## 2021-11-21 DIAGNOSIS — E1122 Type 2 diabetes mellitus with diabetic chronic kidney disease: Secondary | ICD-10-CM | POA: Diagnosis not present

## 2021-11-21 DIAGNOSIS — D696 Thrombocytopenia, unspecified: Secondary | ICD-10-CM | POA: Diagnosis not present

## 2021-11-21 DIAGNOSIS — E1151 Type 2 diabetes mellitus with diabetic peripheral angiopathy without gangrene: Secondary | ICD-10-CM | POA: Diagnosis not present

## 2021-11-21 DIAGNOSIS — B351 Tinea unguium: Secondary | ICD-10-CM | POA: Diagnosis not present

## 2021-11-21 DIAGNOSIS — G309 Alzheimer's disease, unspecified: Secondary | ICD-10-CM | POA: Diagnosis not present

## 2021-11-21 DIAGNOSIS — M2042 Other hammer toe(s) (acquired), left foot: Secondary | ICD-10-CM | POA: Diagnosis not present

## 2021-11-21 DIAGNOSIS — M170 Bilateral primary osteoarthritis of knee: Secondary | ICD-10-CM | POA: Diagnosis not present

## 2021-11-21 DIAGNOSIS — I051 Rheumatic mitral insufficiency: Secondary | ICD-10-CM | POA: Diagnosis not present

## 2021-11-21 DIAGNOSIS — N183 Chronic kidney disease, stage 3 unspecified: Secondary | ICD-10-CM | POA: Diagnosis not present

## 2021-11-21 DIAGNOSIS — F0280 Dementia in other diseases classified elsewhere without behavioral disturbance: Secondary | ICD-10-CM | POA: Diagnosis not present

## 2021-11-21 DIAGNOSIS — I129 Hypertensive chronic kidney disease with stage 1 through stage 4 chronic kidney disease, or unspecified chronic kidney disease: Secondary | ICD-10-CM | POA: Diagnosis not present

## 2021-11-21 DIAGNOSIS — N179 Acute kidney failure, unspecified: Secondary | ICD-10-CM | POA: Diagnosis not present

## 2021-11-28 DIAGNOSIS — E1122 Type 2 diabetes mellitus with diabetic chronic kidney disease: Secondary | ICD-10-CM | POA: Diagnosis not present

## 2021-11-28 DIAGNOSIS — F0280 Dementia in other diseases classified elsewhere without behavioral disturbance: Secondary | ICD-10-CM | POA: Diagnosis not present

## 2021-11-28 DIAGNOSIS — G309 Alzheimer's disease, unspecified: Secondary | ICD-10-CM | POA: Diagnosis not present

## 2021-11-28 DIAGNOSIS — D696 Thrombocytopenia, unspecified: Secondary | ICD-10-CM | POA: Diagnosis not present

## 2021-11-28 DIAGNOSIS — N179 Acute kidney failure, unspecified: Secondary | ICD-10-CM | POA: Diagnosis not present

## 2021-11-28 DIAGNOSIS — E1151 Type 2 diabetes mellitus with diabetic peripheral angiopathy without gangrene: Secondary | ICD-10-CM | POA: Diagnosis not present

## 2021-12-07 DIAGNOSIS — N179 Acute kidney failure, unspecified: Secondary | ICD-10-CM | POA: Diagnosis not present

## 2021-12-07 DIAGNOSIS — E1122 Type 2 diabetes mellitus with diabetic chronic kidney disease: Secondary | ICD-10-CM | POA: Diagnosis not present

## 2021-12-07 DIAGNOSIS — F0280 Dementia in other diseases classified elsewhere without behavioral disturbance: Secondary | ICD-10-CM | POA: Diagnosis not present

## 2021-12-07 DIAGNOSIS — D696 Thrombocytopenia, unspecified: Secondary | ICD-10-CM | POA: Diagnosis not present

## 2021-12-07 DIAGNOSIS — G309 Alzheimer's disease, unspecified: Secondary | ICD-10-CM | POA: Diagnosis not present

## 2021-12-07 DIAGNOSIS — E1151 Type 2 diabetes mellitus with diabetic peripheral angiopathy without gangrene: Secondary | ICD-10-CM | POA: Diagnosis not present

## 2021-12-14 DIAGNOSIS — F0280 Dementia in other diseases classified elsewhere without behavioral disturbance: Secondary | ICD-10-CM | POA: Diagnosis not present

## 2021-12-14 DIAGNOSIS — D696 Thrombocytopenia, unspecified: Secondary | ICD-10-CM | POA: Diagnosis not present

## 2021-12-14 DIAGNOSIS — E1122 Type 2 diabetes mellitus with diabetic chronic kidney disease: Secondary | ICD-10-CM | POA: Diagnosis not present

## 2021-12-14 DIAGNOSIS — E1151 Type 2 diabetes mellitus with diabetic peripheral angiopathy without gangrene: Secondary | ICD-10-CM | POA: Diagnosis not present

## 2021-12-14 DIAGNOSIS — G309 Alzheimer's disease, unspecified: Secondary | ICD-10-CM | POA: Diagnosis not present

## 2021-12-14 DIAGNOSIS — N179 Acute kidney failure, unspecified: Secondary | ICD-10-CM | POA: Diagnosis not present

## 2021-12-21 DIAGNOSIS — F0280 Dementia in other diseases classified elsewhere without behavioral disturbance: Secondary | ICD-10-CM | POA: Diagnosis not present

## 2021-12-21 DIAGNOSIS — B351 Tinea unguium: Secondary | ICD-10-CM | POA: Diagnosis not present

## 2021-12-21 DIAGNOSIS — I129 Hypertensive chronic kidney disease with stage 1 through stage 4 chronic kidney disease, or unspecified chronic kidney disease: Secondary | ICD-10-CM | POA: Diagnosis not present

## 2021-12-21 DIAGNOSIS — M2042 Other hammer toe(s) (acquired), left foot: Secondary | ICD-10-CM | POA: Diagnosis not present

## 2021-12-21 DIAGNOSIS — I051 Rheumatic mitral insufficiency: Secondary | ICD-10-CM | POA: Diagnosis not present

## 2021-12-21 DIAGNOSIS — E1151 Type 2 diabetes mellitus with diabetic peripheral angiopathy without gangrene: Secondary | ICD-10-CM | POA: Diagnosis not present

## 2021-12-21 DIAGNOSIS — N183 Chronic kidney disease, stage 3 unspecified: Secondary | ICD-10-CM | POA: Diagnosis not present

## 2021-12-21 DIAGNOSIS — D696 Thrombocytopenia, unspecified: Secondary | ICD-10-CM | POA: Diagnosis not present

## 2021-12-21 DIAGNOSIS — N179 Acute kidney failure, unspecified: Secondary | ICD-10-CM | POA: Diagnosis not present

## 2021-12-21 DIAGNOSIS — M170 Bilateral primary osteoarthritis of knee: Secondary | ICD-10-CM | POA: Diagnosis not present

## 2021-12-21 DIAGNOSIS — G309 Alzheimer's disease, unspecified: Secondary | ICD-10-CM | POA: Diagnosis not present

## 2021-12-21 DIAGNOSIS — E1122 Type 2 diabetes mellitus with diabetic chronic kidney disease: Secondary | ICD-10-CM | POA: Diagnosis not present

## 2021-12-21 DIAGNOSIS — Z9181 History of falling: Secondary | ICD-10-CM | POA: Diagnosis not present

## 2021-12-24 DIAGNOSIS — M47812 Spondylosis without myelopathy or radiculopathy, cervical region: Secondary | ICD-10-CM | POA: Diagnosis not present

## 2021-12-24 DIAGNOSIS — I129 Hypertensive chronic kidney disease with stage 1 through stage 4 chronic kidney disease, or unspecified chronic kidney disease: Secondary | ICD-10-CM | POA: Diagnosis not present

## 2021-12-24 DIAGNOSIS — S0003XA Contusion of scalp, initial encounter: Secondary | ICD-10-CM | POA: Diagnosis not present

## 2021-12-24 DIAGNOSIS — R519 Headache, unspecified: Secondary | ICD-10-CM | POA: Diagnosis not present

## 2021-12-24 DIAGNOSIS — G319 Degenerative disease of nervous system, unspecified: Secondary | ICD-10-CM | POA: Diagnosis not present

## 2021-12-24 DIAGNOSIS — F419 Anxiety disorder, unspecified: Secondary | ICD-10-CM | POA: Diagnosis not present

## 2021-12-24 DIAGNOSIS — S0083XA Contusion of other part of head, initial encounter: Secondary | ICD-10-CM | POA: Diagnosis not present

## 2021-12-24 DIAGNOSIS — N184 Chronic kidney disease, stage 4 (severe): Secondary | ICD-10-CM | POA: Diagnosis not present

## 2021-12-24 DIAGNOSIS — M5030 Other cervical disc degeneration, unspecified cervical region: Secondary | ICD-10-CM | POA: Diagnosis not present

## 2021-12-24 DIAGNOSIS — G8911 Acute pain due to trauma: Secondary | ICD-10-CM | POA: Diagnosis not present

## 2021-12-24 DIAGNOSIS — I6781 Acute cerebrovascular insufficiency: Secondary | ICD-10-CM | POA: Diagnosis not present

## 2021-12-24 DIAGNOSIS — E1122 Type 2 diabetes mellitus with diabetic chronic kidney disease: Secondary | ICD-10-CM | POA: Diagnosis not present

## 2021-12-25 DIAGNOSIS — G309 Alzheimer's disease, unspecified: Secondary | ICD-10-CM | POA: Diagnosis not present

## 2021-12-25 DIAGNOSIS — D696 Thrombocytopenia, unspecified: Secondary | ICD-10-CM | POA: Diagnosis not present

## 2021-12-25 DIAGNOSIS — E1151 Type 2 diabetes mellitus with diabetic peripheral angiopathy without gangrene: Secondary | ICD-10-CM | POA: Diagnosis not present

## 2021-12-25 DIAGNOSIS — E1122 Type 2 diabetes mellitus with diabetic chronic kidney disease: Secondary | ICD-10-CM | POA: Diagnosis not present

## 2021-12-25 DIAGNOSIS — N179 Acute kidney failure, unspecified: Secondary | ICD-10-CM | POA: Diagnosis not present

## 2021-12-25 DIAGNOSIS — F0280 Dementia in other diseases classified elsewhere without behavioral disturbance: Secondary | ICD-10-CM | POA: Diagnosis not present

## 2022-01-12 ENCOUNTER — Ambulatory Visit: Payer: Medicare Other | Admitting: Podiatry

## 2022-01-29 ENCOUNTER — Ambulatory Visit (INDEPENDENT_AMBULATORY_CARE_PROVIDER_SITE_OTHER): Payer: Medicare Other | Admitting: Podiatry

## 2022-01-29 ENCOUNTER — Encounter: Payer: Self-pay | Admitting: Podiatry

## 2022-01-29 DIAGNOSIS — M21621 Bunionette of right foot: Secondary | ICD-10-CM

## 2022-01-29 DIAGNOSIS — M79675 Pain in left toe(s): Secondary | ICD-10-CM | POA: Diagnosis not present

## 2022-01-29 DIAGNOSIS — M21622 Bunionette of left foot: Secondary | ICD-10-CM

## 2022-01-29 DIAGNOSIS — E1151 Type 2 diabetes mellitus with diabetic peripheral angiopathy without gangrene: Secondary | ICD-10-CM

## 2022-01-29 DIAGNOSIS — M2012 Hallux valgus (acquired), left foot: Secondary | ICD-10-CM

## 2022-01-29 DIAGNOSIS — M79674 Pain in right toe(s): Secondary | ICD-10-CM

## 2022-01-29 DIAGNOSIS — L84 Corns and callosities: Secondary | ICD-10-CM

## 2022-01-29 DIAGNOSIS — B351 Tinea unguium: Secondary | ICD-10-CM

## 2022-01-29 NOTE — Progress Notes (Signed)
This patient returns to my office for at risk foot care.  This patient requires this care by a professional since this patient will be at risk due to having chronic kidney disease and type 2 diabetes.  This patient is unable to cut nails herself since the patient cannot reach her nails.These nails are painful walking and wearing shoes.  This patient presents for at risk foot care today with her son.  She has painful callus over the bony prominence over big toe joint right foot.  Patient denies drainage.  General Appearance  Alert, conversant and in no acute stress.  Vascular  Dorsalis pedis and posterior tibial  pulses are weakly  palpable  bilaterally.  Capillary return is diminished.  bilaterally. Cold feet bilaterally. Absent digital hair  B/L.  Neurologic  Senn-Weinstein monofilament wire test within normal limits  bilaterally. Muscle power within normal limits bilaterally.  Nails Thick disfigured discolored nails with subungual debris  from hallux to fifth toes bilaterally. No evidence of bacterial infection or drainage bilaterally.  Orthopedic  No limitations of motion  feet .  No crepitus or effusions noted.  No bony pathology or digital deformities noted.  Skin  normotropic skin with no porokeratosis noted bilaterally.  No signs of infections or ulcers noted.   Clavi 3rd digit right foot resolved. Necrotic tissue noted 5th MPJ left foot.    Onychomycosis  Pain in right toes  Pain in left toes   Callus at dorsomedial exostosis right foot.  Callus over 5th MPJ left foot.  Consent was obtained for treatment procedures.   Mechanical debridement of nails 1-5  bilaterally performed with a nail nipper.  Filed with dremel without incident. Debride callus   the dorsomedial exostosis 1st MPJ right foot.  Debrided with dremel tool.   Return office visit  12 weeks                Told patient to return for periodic foot care and evaluation due to potential at risk complications.   Gardiner Barefoot DPM

## 2022-02-01 DIAGNOSIS — R296 Repeated falls: Secondary | ICD-10-CM | POA: Diagnosis not present

## 2022-02-01 DIAGNOSIS — I129 Hypertensive chronic kidney disease with stage 1 through stage 4 chronic kidney disease, or unspecified chronic kidney disease: Secondary | ICD-10-CM | POA: Diagnosis not present

## 2022-02-01 DIAGNOSIS — F5101 Primary insomnia: Secondary | ICD-10-CM | POA: Diagnosis not present

## 2022-02-01 DIAGNOSIS — R4181 Age-related cognitive decline: Secondary | ICD-10-CM | POA: Diagnosis not present

## 2022-02-07 DIAGNOSIS — W19XXXA Unspecified fall, initial encounter: Secondary | ICD-10-CM | POA: Diagnosis not present

## 2022-02-07 DIAGNOSIS — N1831 Chronic kidney disease, stage 3a: Secondary | ICD-10-CM | POA: Diagnosis not present

## 2022-02-07 DIAGNOSIS — I129 Hypertensive chronic kidney disease with stage 1 through stage 4 chronic kidney disease, or unspecified chronic kidney disease: Secondary | ICD-10-CM | POA: Diagnosis not present

## 2022-02-07 DIAGNOSIS — S01111D Laceration without foreign body of right eyelid and periocular area, subsequent encounter: Secondary | ICD-10-CM | POA: Diagnosis not present

## 2022-02-07 DIAGNOSIS — S0531XA Ocular laceration without prolapse or loss of intraocular tissue, right eye, initial encounter: Secondary | ICD-10-CM | POA: Diagnosis not present

## 2022-02-07 DIAGNOSIS — F0280 Dementia in other diseases classified elsewhere without behavioral disturbance: Secondary | ICD-10-CM | POA: Diagnosis not present

## 2022-02-15 DIAGNOSIS — R0789 Other chest pain: Secondary | ICD-10-CM | POA: Diagnosis not present

## 2022-02-15 DIAGNOSIS — M545 Low back pain, unspecified: Secondary | ICD-10-CM | POA: Diagnosis not present

## 2022-02-23 ENCOUNTER — Other Ambulatory Visit: Payer: Self-pay | Admitting: Internal Medicine

## 2022-02-23 ENCOUNTER — Ambulatory Visit
Admission: RE | Admit: 2022-02-23 | Discharge: 2022-02-23 | Disposition: A | Discharge status: Home / Self Care | Payer: Medicare Other | Source: Ambulatory Visit | Attending: Internal Medicine | Admitting: Internal Medicine

## 2022-02-23 DIAGNOSIS — R0789 Other chest pain: Secondary | ICD-10-CM

## 2022-02-23 DIAGNOSIS — M419 Scoliosis, unspecified: Secondary | ICD-10-CM | POA: Diagnosis not present

## 2022-02-23 DIAGNOSIS — S2241XA Multiple fractures of ribs, right side, initial encounter for closed fracture: Secondary | ICD-10-CM | POA: Diagnosis not present

## 2022-02-23 DIAGNOSIS — I517 Cardiomegaly: Secondary | ICD-10-CM | POA: Diagnosis not present

## 2022-03-03 DIAGNOSIS — G309 Alzheimer's disease, unspecified: Secondary | ICD-10-CM | POA: Diagnosis not present

## 2022-03-03 DIAGNOSIS — I129 Hypertensive chronic kidney disease with stage 1 through stage 4 chronic kidney disease, or unspecified chronic kidney disease: Secondary | ICD-10-CM | POA: Diagnosis not present

## 2022-03-03 DIAGNOSIS — I872 Venous insufficiency (chronic) (peripheral): Secondary | ICD-10-CM | POA: Diagnosis not present

## 2022-03-03 DIAGNOSIS — E1151 Type 2 diabetes mellitus with diabetic peripheral angiopathy without gangrene: Secondary | ICD-10-CM | POA: Diagnosis not present

## 2022-03-03 DIAGNOSIS — S80821D Blister (nonthermal), right lower leg, subsequent encounter: Secondary | ICD-10-CM | POA: Diagnosis not present

## 2022-03-03 DIAGNOSIS — F0284 Dementia in other diseases classified elsewhere, unspecified severity, with anxiety: Secondary | ICD-10-CM | POA: Diagnosis not present

## 2022-03-03 DIAGNOSIS — H269 Unspecified cataract: Secondary | ICD-10-CM | POA: Diagnosis not present

## 2022-03-03 DIAGNOSIS — M8580 Other specified disorders of bone density and structure, unspecified site: Secondary | ICD-10-CM | POA: Diagnosis not present

## 2022-03-03 DIAGNOSIS — F5101 Primary insomnia: Secondary | ICD-10-CM | POA: Diagnosis not present

## 2022-03-03 DIAGNOSIS — N184 Chronic kidney disease, stage 4 (severe): Secondary | ICD-10-CM | POA: Diagnosis not present

## 2022-03-03 DIAGNOSIS — E1122 Type 2 diabetes mellitus with diabetic chronic kidney disease: Secondary | ICD-10-CM | POA: Diagnosis not present

## 2022-03-03 DIAGNOSIS — Z556 Problems related to health literacy: Secondary | ICD-10-CM | POA: Diagnosis not present

## 2022-03-03 DIAGNOSIS — M5441 Lumbago with sciatica, right side: Secondary | ICD-10-CM | POA: Diagnosis not present

## 2022-03-03 DIAGNOSIS — M170 Bilateral primary osteoarthritis of knee: Secondary | ICD-10-CM | POA: Diagnosis not present

## 2022-03-07 DIAGNOSIS — N184 Chronic kidney disease, stage 4 (severe): Secondary | ICD-10-CM | POA: Diagnosis not present

## 2022-03-07 DIAGNOSIS — E1122 Type 2 diabetes mellitus with diabetic chronic kidney disease: Secondary | ICD-10-CM | POA: Diagnosis not present

## 2022-03-07 DIAGNOSIS — I872 Venous insufficiency (chronic) (peripheral): Secondary | ICD-10-CM | POA: Diagnosis not present

## 2022-03-07 DIAGNOSIS — M5441 Lumbago with sciatica, right side: Secondary | ICD-10-CM | POA: Diagnosis not present

## 2022-03-07 DIAGNOSIS — I129 Hypertensive chronic kidney disease with stage 1 through stage 4 chronic kidney disease, or unspecified chronic kidney disease: Secondary | ICD-10-CM | POA: Diagnosis not present

## 2022-03-07 DIAGNOSIS — E1151 Type 2 diabetes mellitus with diabetic peripheral angiopathy without gangrene: Secondary | ICD-10-CM | POA: Diagnosis not present

## 2022-03-08 DIAGNOSIS — E1122 Type 2 diabetes mellitus with diabetic chronic kidney disease: Secondary | ICD-10-CM | POA: Diagnosis not present

## 2022-03-08 DIAGNOSIS — N184 Chronic kidney disease, stage 4 (severe): Secondary | ICD-10-CM | POA: Diagnosis not present

## 2022-03-08 DIAGNOSIS — I872 Venous insufficiency (chronic) (peripheral): Secondary | ICD-10-CM | POA: Diagnosis not present

## 2022-03-08 DIAGNOSIS — M5441 Lumbago with sciatica, right side: Secondary | ICD-10-CM | POA: Diagnosis not present

## 2022-03-08 DIAGNOSIS — I129 Hypertensive chronic kidney disease with stage 1 through stage 4 chronic kidney disease, or unspecified chronic kidney disease: Secondary | ICD-10-CM | POA: Diagnosis not present

## 2022-03-08 DIAGNOSIS — E1151 Type 2 diabetes mellitus with diabetic peripheral angiopathy without gangrene: Secondary | ICD-10-CM | POA: Diagnosis not present

## 2022-03-09 DIAGNOSIS — I129 Hypertensive chronic kidney disease with stage 1 through stage 4 chronic kidney disease, or unspecified chronic kidney disease: Secondary | ICD-10-CM | POA: Diagnosis not present

## 2022-03-09 DIAGNOSIS — I872 Venous insufficiency (chronic) (peripheral): Secondary | ICD-10-CM | POA: Diagnosis not present

## 2022-03-09 DIAGNOSIS — E1151 Type 2 diabetes mellitus with diabetic peripheral angiopathy without gangrene: Secondary | ICD-10-CM | POA: Diagnosis not present

## 2022-03-09 DIAGNOSIS — E1122 Type 2 diabetes mellitus with diabetic chronic kidney disease: Secondary | ICD-10-CM | POA: Diagnosis not present

## 2022-03-09 DIAGNOSIS — N184 Chronic kidney disease, stage 4 (severe): Secondary | ICD-10-CM | POA: Diagnosis not present

## 2022-03-09 DIAGNOSIS — M5441 Lumbago with sciatica, right side: Secondary | ICD-10-CM | POA: Diagnosis not present

## 2022-03-12 DIAGNOSIS — E1122 Type 2 diabetes mellitus with diabetic chronic kidney disease: Secondary | ICD-10-CM | POA: Diagnosis not present

## 2022-03-12 DIAGNOSIS — I129 Hypertensive chronic kidney disease with stage 1 through stage 4 chronic kidney disease, or unspecified chronic kidney disease: Secondary | ICD-10-CM | POA: Diagnosis not present

## 2022-03-12 DIAGNOSIS — I872 Venous insufficiency (chronic) (peripheral): Secondary | ICD-10-CM | POA: Diagnosis not present

## 2022-03-12 DIAGNOSIS — N184 Chronic kidney disease, stage 4 (severe): Secondary | ICD-10-CM | POA: Diagnosis not present

## 2022-03-12 DIAGNOSIS — E1151 Type 2 diabetes mellitus with diabetic peripheral angiopathy without gangrene: Secondary | ICD-10-CM | POA: Diagnosis not present

## 2022-03-12 DIAGNOSIS — M5441 Lumbago with sciatica, right side: Secondary | ICD-10-CM | POA: Diagnosis not present

## 2022-03-14 DIAGNOSIS — E1151 Type 2 diabetes mellitus with diabetic peripheral angiopathy without gangrene: Secondary | ICD-10-CM | POA: Diagnosis not present

## 2022-03-14 DIAGNOSIS — M5441 Lumbago with sciatica, right side: Secondary | ICD-10-CM | POA: Diagnosis not present

## 2022-03-14 DIAGNOSIS — I872 Venous insufficiency (chronic) (peripheral): Secondary | ICD-10-CM | POA: Diagnosis not present

## 2022-03-14 DIAGNOSIS — N184 Chronic kidney disease, stage 4 (severe): Secondary | ICD-10-CM | POA: Diagnosis not present

## 2022-03-14 DIAGNOSIS — E1122 Type 2 diabetes mellitus with diabetic chronic kidney disease: Secondary | ICD-10-CM | POA: Diagnosis not present

## 2022-03-14 DIAGNOSIS — I129 Hypertensive chronic kidney disease with stage 1 through stage 4 chronic kidney disease, or unspecified chronic kidney disease: Secondary | ICD-10-CM | POA: Diagnosis not present

## 2022-03-19 DIAGNOSIS — E1151 Type 2 diabetes mellitus with diabetic peripheral angiopathy without gangrene: Secondary | ICD-10-CM | POA: Diagnosis not present

## 2022-03-19 DIAGNOSIS — E1122 Type 2 diabetes mellitus with diabetic chronic kidney disease: Secondary | ICD-10-CM | POA: Diagnosis not present

## 2022-03-19 DIAGNOSIS — I872 Venous insufficiency (chronic) (peripheral): Secondary | ICD-10-CM | POA: Diagnosis not present

## 2022-03-19 DIAGNOSIS — I129 Hypertensive chronic kidney disease with stage 1 through stage 4 chronic kidney disease, or unspecified chronic kidney disease: Secondary | ICD-10-CM | POA: Diagnosis not present

## 2022-03-19 DIAGNOSIS — M5441 Lumbago with sciatica, right side: Secondary | ICD-10-CM | POA: Diagnosis not present

## 2022-03-19 DIAGNOSIS — N184 Chronic kidney disease, stage 4 (severe): Secondary | ICD-10-CM | POA: Diagnosis not present

## 2022-03-20 DIAGNOSIS — I872 Venous insufficiency (chronic) (peripheral): Secondary | ICD-10-CM | POA: Diagnosis not present

## 2022-03-20 DIAGNOSIS — M5441 Lumbago with sciatica, right side: Secondary | ICD-10-CM | POA: Diagnosis not present

## 2022-03-20 DIAGNOSIS — N184 Chronic kidney disease, stage 4 (severe): Secondary | ICD-10-CM | POA: Diagnosis not present

## 2022-03-20 DIAGNOSIS — E1122 Type 2 diabetes mellitus with diabetic chronic kidney disease: Secondary | ICD-10-CM | POA: Diagnosis not present

## 2022-03-20 DIAGNOSIS — E1151 Type 2 diabetes mellitus with diabetic peripheral angiopathy without gangrene: Secondary | ICD-10-CM | POA: Diagnosis not present

## 2022-03-20 DIAGNOSIS — I129 Hypertensive chronic kidney disease with stage 1 through stage 4 chronic kidney disease, or unspecified chronic kidney disease: Secondary | ICD-10-CM | POA: Diagnosis not present

## 2022-03-22 DIAGNOSIS — M5441 Lumbago with sciatica, right side: Secondary | ICD-10-CM | POA: Diagnosis not present

## 2022-03-22 DIAGNOSIS — E1151 Type 2 diabetes mellitus with diabetic peripheral angiopathy without gangrene: Secondary | ICD-10-CM | POA: Diagnosis not present

## 2022-03-22 DIAGNOSIS — I872 Venous insufficiency (chronic) (peripheral): Secondary | ICD-10-CM | POA: Diagnosis not present

## 2022-03-22 DIAGNOSIS — I129 Hypertensive chronic kidney disease with stage 1 through stage 4 chronic kidney disease, or unspecified chronic kidney disease: Secondary | ICD-10-CM | POA: Diagnosis not present

## 2022-03-22 DIAGNOSIS — E1122 Type 2 diabetes mellitus with diabetic chronic kidney disease: Secondary | ICD-10-CM | POA: Diagnosis not present

## 2022-03-22 DIAGNOSIS — N184 Chronic kidney disease, stage 4 (severe): Secondary | ICD-10-CM | POA: Diagnosis not present

## 2022-03-27 DIAGNOSIS — M5441 Lumbago with sciatica, right side: Secondary | ICD-10-CM | POA: Diagnosis not present

## 2022-03-27 DIAGNOSIS — I872 Venous insufficiency (chronic) (peripheral): Secondary | ICD-10-CM | POA: Diagnosis not present

## 2022-03-27 DIAGNOSIS — E1151 Type 2 diabetes mellitus with diabetic peripheral angiopathy without gangrene: Secondary | ICD-10-CM | POA: Diagnosis not present

## 2022-03-27 DIAGNOSIS — N184 Chronic kidney disease, stage 4 (severe): Secondary | ICD-10-CM | POA: Diagnosis not present

## 2022-03-27 DIAGNOSIS — I129 Hypertensive chronic kidney disease with stage 1 through stage 4 chronic kidney disease, or unspecified chronic kidney disease: Secondary | ICD-10-CM | POA: Diagnosis not present

## 2022-03-27 DIAGNOSIS — E1122 Type 2 diabetes mellitus with diabetic chronic kidney disease: Secondary | ICD-10-CM | POA: Diagnosis not present

## 2022-03-28 DIAGNOSIS — N184 Chronic kidney disease, stage 4 (severe): Secondary | ICD-10-CM | POA: Diagnosis not present

## 2022-03-28 DIAGNOSIS — E1151 Type 2 diabetes mellitus with diabetic peripheral angiopathy without gangrene: Secondary | ICD-10-CM | POA: Diagnosis not present

## 2022-03-28 DIAGNOSIS — I129 Hypertensive chronic kidney disease with stage 1 through stage 4 chronic kidney disease, or unspecified chronic kidney disease: Secondary | ICD-10-CM | POA: Diagnosis not present

## 2022-03-28 DIAGNOSIS — M5441 Lumbago with sciatica, right side: Secondary | ICD-10-CM | POA: Diagnosis not present

## 2022-03-28 DIAGNOSIS — I872 Venous insufficiency (chronic) (peripheral): Secondary | ICD-10-CM | POA: Diagnosis not present

## 2022-03-28 DIAGNOSIS — E1122 Type 2 diabetes mellitus with diabetic chronic kidney disease: Secondary | ICD-10-CM | POA: Diagnosis not present

## 2022-03-30 DIAGNOSIS — I872 Venous insufficiency (chronic) (peripheral): Secondary | ICD-10-CM | POA: Diagnosis not present

## 2022-03-30 DIAGNOSIS — E1122 Type 2 diabetes mellitus with diabetic chronic kidney disease: Secondary | ICD-10-CM | POA: Diagnosis not present

## 2022-03-30 DIAGNOSIS — N184 Chronic kidney disease, stage 4 (severe): Secondary | ICD-10-CM | POA: Diagnosis not present

## 2022-03-30 DIAGNOSIS — E1151 Type 2 diabetes mellitus with diabetic peripheral angiopathy without gangrene: Secondary | ICD-10-CM | POA: Diagnosis not present

## 2022-03-30 DIAGNOSIS — I129 Hypertensive chronic kidney disease with stage 1 through stage 4 chronic kidney disease, or unspecified chronic kidney disease: Secondary | ICD-10-CM | POA: Diagnosis not present

## 2022-03-30 DIAGNOSIS — M5441 Lumbago with sciatica, right side: Secondary | ICD-10-CM | POA: Diagnosis not present

## 2022-04-02 DIAGNOSIS — S80821D Blister (nonthermal), right lower leg, subsequent encounter: Secondary | ICD-10-CM | POA: Diagnosis not present

## 2022-04-02 DIAGNOSIS — E1122 Type 2 diabetes mellitus with diabetic chronic kidney disease: Secondary | ICD-10-CM | POA: Diagnosis not present

## 2022-04-02 DIAGNOSIS — M5441 Lumbago with sciatica, right side: Secondary | ICD-10-CM | POA: Diagnosis not present

## 2022-04-02 DIAGNOSIS — M8580 Other specified disorders of bone density and structure, unspecified site: Secondary | ICD-10-CM | POA: Diagnosis not present

## 2022-04-02 DIAGNOSIS — H269 Unspecified cataract: Secondary | ICD-10-CM | POA: Diagnosis not present

## 2022-04-02 DIAGNOSIS — F0284 Dementia in other diseases classified elsewhere, unspecified severity, with anxiety: Secondary | ICD-10-CM | POA: Diagnosis not present

## 2022-04-02 DIAGNOSIS — F5101 Primary insomnia: Secondary | ICD-10-CM | POA: Diagnosis not present

## 2022-04-02 DIAGNOSIS — E1151 Type 2 diabetes mellitus with diabetic peripheral angiopathy without gangrene: Secondary | ICD-10-CM | POA: Diagnosis not present

## 2022-04-02 DIAGNOSIS — I872 Venous insufficiency (chronic) (peripheral): Secondary | ICD-10-CM | POA: Diagnosis not present

## 2022-04-02 DIAGNOSIS — I129 Hypertensive chronic kidney disease with stage 1 through stage 4 chronic kidney disease, or unspecified chronic kidney disease: Secondary | ICD-10-CM | POA: Diagnosis not present

## 2022-04-02 DIAGNOSIS — M170 Bilateral primary osteoarthritis of knee: Secondary | ICD-10-CM | POA: Diagnosis not present

## 2022-04-02 DIAGNOSIS — N184 Chronic kidney disease, stage 4 (severe): Secondary | ICD-10-CM | POA: Diagnosis not present

## 2022-04-02 DIAGNOSIS — Z556 Problems related to health literacy: Secondary | ICD-10-CM | POA: Diagnosis not present

## 2022-04-02 DIAGNOSIS — G309 Alzheimer's disease, unspecified: Secondary | ICD-10-CM | POA: Diagnosis not present

## 2022-04-03 DIAGNOSIS — N184 Chronic kidney disease, stage 4 (severe): Secondary | ICD-10-CM | POA: Diagnosis not present

## 2022-04-03 DIAGNOSIS — I872 Venous insufficiency (chronic) (peripheral): Secondary | ICD-10-CM | POA: Diagnosis not present

## 2022-04-03 DIAGNOSIS — E1151 Type 2 diabetes mellitus with diabetic peripheral angiopathy without gangrene: Secondary | ICD-10-CM | POA: Diagnosis not present

## 2022-04-03 DIAGNOSIS — I129 Hypertensive chronic kidney disease with stage 1 through stage 4 chronic kidney disease, or unspecified chronic kidney disease: Secondary | ICD-10-CM | POA: Diagnosis not present

## 2022-04-03 DIAGNOSIS — M5441 Lumbago with sciatica, right side: Secondary | ICD-10-CM | POA: Diagnosis not present

## 2022-04-03 DIAGNOSIS — E1122 Type 2 diabetes mellitus with diabetic chronic kidney disease: Secondary | ICD-10-CM | POA: Diagnosis not present

## 2022-04-11 DIAGNOSIS — I872 Venous insufficiency (chronic) (peripheral): Secondary | ICD-10-CM | POA: Diagnosis not present

## 2022-04-11 DIAGNOSIS — E1151 Type 2 diabetes mellitus with diabetic peripheral angiopathy without gangrene: Secondary | ICD-10-CM | POA: Diagnosis not present

## 2022-04-11 DIAGNOSIS — E1122 Type 2 diabetes mellitus with diabetic chronic kidney disease: Secondary | ICD-10-CM | POA: Diagnosis not present

## 2022-04-11 DIAGNOSIS — I129 Hypertensive chronic kidney disease with stage 1 through stage 4 chronic kidney disease, or unspecified chronic kidney disease: Secondary | ICD-10-CM | POA: Diagnosis not present

## 2022-04-11 DIAGNOSIS — M5441 Lumbago with sciatica, right side: Secondary | ICD-10-CM | POA: Diagnosis not present

## 2022-04-11 DIAGNOSIS — N184 Chronic kidney disease, stage 4 (severe): Secondary | ICD-10-CM | POA: Diagnosis not present

## 2022-04-12 DIAGNOSIS — I872 Venous insufficiency (chronic) (peripheral): Secondary | ICD-10-CM | POA: Diagnosis not present

## 2022-04-12 DIAGNOSIS — M5441 Lumbago with sciatica, right side: Secondary | ICD-10-CM | POA: Diagnosis not present

## 2022-04-12 DIAGNOSIS — E1122 Type 2 diabetes mellitus with diabetic chronic kidney disease: Secondary | ICD-10-CM | POA: Diagnosis not present

## 2022-04-12 DIAGNOSIS — I129 Hypertensive chronic kidney disease with stage 1 through stage 4 chronic kidney disease, or unspecified chronic kidney disease: Secondary | ICD-10-CM | POA: Diagnosis not present

## 2022-04-12 DIAGNOSIS — N184 Chronic kidney disease, stage 4 (severe): Secondary | ICD-10-CM | POA: Diagnosis not present

## 2022-04-12 DIAGNOSIS — E1151 Type 2 diabetes mellitus with diabetic peripheral angiopathy without gangrene: Secondary | ICD-10-CM | POA: Diagnosis not present

## 2022-04-13 DIAGNOSIS — I129 Hypertensive chronic kidney disease with stage 1 through stage 4 chronic kidney disease, or unspecified chronic kidney disease: Secondary | ICD-10-CM | POA: Diagnosis not present

## 2022-04-13 DIAGNOSIS — M5441 Lumbago with sciatica, right side: Secondary | ICD-10-CM | POA: Diagnosis not present

## 2022-04-13 DIAGNOSIS — E1122 Type 2 diabetes mellitus with diabetic chronic kidney disease: Secondary | ICD-10-CM | POA: Diagnosis not present

## 2022-04-13 DIAGNOSIS — N184 Chronic kidney disease, stage 4 (severe): Secondary | ICD-10-CM | POA: Diagnosis not present

## 2022-04-13 DIAGNOSIS — I872 Venous insufficiency (chronic) (peripheral): Secondary | ICD-10-CM | POA: Diagnosis not present

## 2022-04-13 DIAGNOSIS — E1151 Type 2 diabetes mellitus with diabetic peripheral angiopathy without gangrene: Secondary | ICD-10-CM | POA: Diagnosis not present

## 2022-04-19 DIAGNOSIS — I872 Venous insufficiency (chronic) (peripheral): Secondary | ICD-10-CM | POA: Diagnosis not present

## 2022-04-19 DIAGNOSIS — N184 Chronic kidney disease, stage 4 (severe): Secondary | ICD-10-CM | POA: Diagnosis not present

## 2022-04-19 DIAGNOSIS — M5441 Lumbago with sciatica, right side: Secondary | ICD-10-CM | POA: Diagnosis not present

## 2022-04-19 DIAGNOSIS — E1122 Type 2 diabetes mellitus with diabetic chronic kidney disease: Secondary | ICD-10-CM | POA: Diagnosis not present

## 2022-04-19 DIAGNOSIS — I129 Hypertensive chronic kidney disease with stage 1 through stage 4 chronic kidney disease, or unspecified chronic kidney disease: Secondary | ICD-10-CM | POA: Diagnosis not present

## 2022-04-19 DIAGNOSIS — E1151 Type 2 diabetes mellitus with diabetic peripheral angiopathy without gangrene: Secondary | ICD-10-CM | POA: Diagnosis not present

## 2022-04-26 DIAGNOSIS — M5441 Lumbago with sciatica, right side: Secondary | ICD-10-CM | POA: Diagnosis not present

## 2022-04-26 DIAGNOSIS — E1151 Type 2 diabetes mellitus with diabetic peripheral angiopathy without gangrene: Secondary | ICD-10-CM | POA: Diagnosis not present

## 2022-04-26 DIAGNOSIS — I129 Hypertensive chronic kidney disease with stage 1 through stage 4 chronic kidney disease, or unspecified chronic kidney disease: Secondary | ICD-10-CM | POA: Diagnosis not present

## 2022-04-26 DIAGNOSIS — I872 Venous insufficiency (chronic) (peripheral): Secondary | ICD-10-CM | POA: Diagnosis not present

## 2022-04-26 DIAGNOSIS — E1122 Type 2 diabetes mellitus with diabetic chronic kidney disease: Secondary | ICD-10-CM | POA: Diagnosis not present

## 2022-04-26 DIAGNOSIS — N184 Chronic kidney disease, stage 4 (severe): Secondary | ICD-10-CM | POA: Diagnosis not present

## 2022-04-27 DIAGNOSIS — Z0000 Encounter for general adult medical examination without abnormal findings: Secondary | ICD-10-CM | POA: Diagnosis not present

## 2022-04-27 DIAGNOSIS — E1122 Type 2 diabetes mellitus with diabetic chronic kidney disease: Secondary | ICD-10-CM | POA: Diagnosis not present

## 2022-04-27 DIAGNOSIS — N184 Chronic kidney disease, stage 4 (severe): Secondary | ICD-10-CM | POA: Diagnosis not present

## 2022-04-27 DIAGNOSIS — G309 Alzheimer's disease, unspecified: Secondary | ICD-10-CM | POA: Diagnosis not present

## 2022-04-27 DIAGNOSIS — T148XXA Other injury of unspecified body region, initial encounter: Secondary | ICD-10-CM | POA: Diagnosis not present

## 2022-04-27 DIAGNOSIS — F0284 Dementia in other diseases classified elsewhere, unspecified severity, with anxiety: Secondary | ICD-10-CM | POA: Diagnosis not present

## 2022-04-27 DIAGNOSIS — F5101 Primary insomnia: Secondary | ICD-10-CM | POA: Diagnosis not present

## 2022-04-27 DIAGNOSIS — Z1331 Encounter for screening for depression: Secondary | ICD-10-CM | POA: Diagnosis not present

## 2022-04-27 DIAGNOSIS — I129 Hypertensive chronic kidney disease with stage 1 through stage 4 chronic kidney disease, or unspecified chronic kidney disease: Secondary | ICD-10-CM | POA: Diagnosis not present

## 2022-04-30 DIAGNOSIS — E1151 Type 2 diabetes mellitus with diabetic peripheral angiopathy without gangrene: Secondary | ICD-10-CM | POA: Diagnosis not present

## 2022-04-30 DIAGNOSIS — N184 Chronic kidney disease, stage 4 (severe): Secondary | ICD-10-CM | POA: Diagnosis not present

## 2022-04-30 DIAGNOSIS — E1122 Type 2 diabetes mellitus with diabetic chronic kidney disease: Secondary | ICD-10-CM | POA: Diagnosis not present

## 2022-04-30 DIAGNOSIS — I129 Hypertensive chronic kidney disease with stage 1 through stage 4 chronic kidney disease, or unspecified chronic kidney disease: Secondary | ICD-10-CM | POA: Diagnosis not present

## 2022-04-30 DIAGNOSIS — I872 Venous insufficiency (chronic) (peripheral): Secondary | ICD-10-CM | POA: Diagnosis not present

## 2022-04-30 DIAGNOSIS — M5441 Lumbago with sciatica, right side: Secondary | ICD-10-CM | POA: Diagnosis not present

## 2022-05-02 ENCOUNTER — Ambulatory Visit: Payer: Medicare Other | Admitting: Podiatry

## 2022-05-02 DIAGNOSIS — Z556 Problems related to health literacy: Secondary | ICD-10-CM | POA: Diagnosis not present

## 2022-05-02 DIAGNOSIS — Z9181 History of falling: Secondary | ICD-10-CM | POA: Diagnosis not present

## 2022-05-02 DIAGNOSIS — N184 Chronic kidney disease, stage 4 (severe): Secondary | ICD-10-CM | POA: Diagnosis not present

## 2022-05-02 DIAGNOSIS — F5101 Primary insomnia: Secondary | ICD-10-CM | POA: Diagnosis not present

## 2022-05-02 DIAGNOSIS — G309 Alzheimer's disease, unspecified: Secondary | ICD-10-CM | POA: Diagnosis not present

## 2022-05-02 DIAGNOSIS — I129 Hypertensive chronic kidney disease with stage 1 through stage 4 chronic kidney disease, or unspecified chronic kidney disease: Secondary | ICD-10-CM | POA: Diagnosis not present

## 2022-05-02 DIAGNOSIS — H269 Unspecified cataract: Secondary | ICD-10-CM | POA: Diagnosis not present

## 2022-05-02 DIAGNOSIS — M8580 Other specified disorders of bone density and structure, unspecified site: Secondary | ICD-10-CM | POA: Diagnosis not present

## 2022-05-02 DIAGNOSIS — M170 Bilateral primary osteoarthritis of knee: Secondary | ICD-10-CM | POA: Diagnosis not present

## 2022-05-02 DIAGNOSIS — F0284 Dementia in other diseases classified elsewhere, unspecified severity, with anxiety: Secondary | ICD-10-CM | POA: Diagnosis not present

## 2022-05-02 DIAGNOSIS — E1151 Type 2 diabetes mellitus with diabetic peripheral angiopathy without gangrene: Secondary | ICD-10-CM | POA: Diagnosis not present

## 2022-05-02 DIAGNOSIS — I872 Venous insufficiency (chronic) (peripheral): Secondary | ICD-10-CM | POA: Diagnosis not present

## 2022-05-02 DIAGNOSIS — R238 Other skin changes: Secondary | ICD-10-CM | POA: Diagnosis not present

## 2022-05-02 DIAGNOSIS — M5441 Lumbago with sciatica, right side: Secondary | ICD-10-CM | POA: Diagnosis not present

## 2022-05-02 DIAGNOSIS — E1122 Type 2 diabetes mellitus with diabetic chronic kidney disease: Secondary | ICD-10-CM | POA: Diagnosis not present

## 2022-05-02 DIAGNOSIS — S81802D Unspecified open wound, left lower leg, subsequent encounter: Secondary | ICD-10-CM | POA: Diagnosis not present

## 2022-05-02 DIAGNOSIS — F02818 Dementia in other diseases classified elsewhere, unspecified severity, with other behavioral disturbance: Secondary | ICD-10-CM | POA: Diagnosis not present

## 2022-05-10 DIAGNOSIS — N184 Chronic kidney disease, stage 4 (severe): Secondary | ICD-10-CM | POA: Diagnosis not present

## 2022-05-10 DIAGNOSIS — E1122 Type 2 diabetes mellitus with diabetic chronic kidney disease: Secondary | ICD-10-CM | POA: Diagnosis not present

## 2022-05-10 DIAGNOSIS — S81802D Unspecified open wound, left lower leg, subsequent encounter: Secondary | ICD-10-CM | POA: Diagnosis not present

## 2022-05-10 DIAGNOSIS — M5441 Lumbago with sciatica, right side: Secondary | ICD-10-CM | POA: Diagnosis not present

## 2022-05-10 DIAGNOSIS — I129 Hypertensive chronic kidney disease with stage 1 through stage 4 chronic kidney disease, or unspecified chronic kidney disease: Secondary | ICD-10-CM | POA: Diagnosis not present

## 2022-05-10 DIAGNOSIS — R238 Other skin changes: Secondary | ICD-10-CM | POA: Diagnosis not present

## 2022-05-14 DIAGNOSIS — N184 Chronic kidney disease, stage 4 (severe): Secondary | ICD-10-CM | POA: Diagnosis not present

## 2022-05-14 DIAGNOSIS — M5441 Lumbago with sciatica, right side: Secondary | ICD-10-CM | POA: Diagnosis not present

## 2022-05-14 DIAGNOSIS — E1122 Type 2 diabetes mellitus with diabetic chronic kidney disease: Secondary | ICD-10-CM | POA: Diagnosis not present

## 2022-05-14 DIAGNOSIS — I129 Hypertensive chronic kidney disease with stage 1 through stage 4 chronic kidney disease, or unspecified chronic kidney disease: Secondary | ICD-10-CM | POA: Diagnosis not present

## 2022-05-14 DIAGNOSIS — R238 Other skin changes: Secondary | ICD-10-CM | POA: Diagnosis not present

## 2022-05-14 DIAGNOSIS — S81802D Unspecified open wound, left lower leg, subsequent encounter: Secondary | ICD-10-CM | POA: Diagnosis not present

## 2022-05-17 ENCOUNTER — Encounter: Payer: Self-pay | Admitting: Podiatry

## 2022-05-17 ENCOUNTER — Ambulatory Visit (INDEPENDENT_AMBULATORY_CARE_PROVIDER_SITE_OTHER): Payer: Medicare Other | Admitting: Podiatry

## 2022-05-17 DIAGNOSIS — B351 Tinea unguium: Secondary | ICD-10-CM

## 2022-05-17 DIAGNOSIS — M79675 Pain in left toe(s): Secondary | ICD-10-CM | POA: Diagnosis not present

## 2022-05-17 DIAGNOSIS — M79674 Pain in right toe(s): Secondary | ICD-10-CM

## 2022-05-17 DIAGNOSIS — E1151 Type 2 diabetes mellitus with diabetic peripheral angiopathy without gangrene: Secondary | ICD-10-CM | POA: Diagnosis not present

## 2022-05-17 NOTE — Progress Notes (Signed)
This patient returns to my office for at risk foot care.  This patient requires this care by a professional since this patient will be at risk due to having chronic kidney disease and type 2 diabetes.  This patient is unable to cut nails herself since the patient cannot reach her nails.These nails are painful walking and wearing shoes.  This patient presents for at risk foot care today with her son.  She has painful callus over the bony prominence over big toe joint right foot.  Patient denies drainage.  General Appearance  Alert, conversant and in no acute stress.  Vascular  Dorsalis pedis and posterior tibial  pulses are weakly  palpable  bilaterally.  Capillary return is diminished.  bilaterally. Cold feet bilaterally. Absent digital hair  B/L.  Neurologic  Senn-Weinstein monofilament wire test within normal limits  bilaterally. Muscle power within normal limits bilaterally.  Nails Thick disfigured discolored nails with subungual debris  from hallux to fifth toes bilaterally. No evidence of bacterial infection or drainage bilaterally.  Orthopedic  No limitations of motion  feet .  No crepitus or effusions noted.  No bony pathology or digital deformities noted.  Skin  normotropic skin with no porokeratosis noted bilaterally.  No signs of infections or ulcers noted.   Clavi 3rd digit right foot resolved. Necrotic tissue noted 5th MPJ left foot.    Onychomycosis  Pain in right toes  Pain in left toes   Callus at dorsomedial exostosis right foot.  Callus over 5th MPJ left foot.  Consent was obtained for treatment procedures.   Mechanical debridement of nails 1-5  bilaterally performed with a nail nipper.  Filed with dremel without incident. Debride callus   the dorsomedial exostosis 1st MPJ right foot.  Debrided with dremel tool.   Return office visit  12 weeks                Told patient to return for periodic foot care and evaluation due to potential at risk complications.   Hermes Wafer DPM   

## 2022-05-29 DIAGNOSIS — E1122 Type 2 diabetes mellitus with diabetic chronic kidney disease: Secondary | ICD-10-CM | POA: Diagnosis not present

## 2022-05-29 DIAGNOSIS — N184 Chronic kidney disease, stage 4 (severe): Secondary | ICD-10-CM | POA: Diagnosis not present

## 2022-05-29 DIAGNOSIS — I129 Hypertensive chronic kidney disease with stage 1 through stage 4 chronic kidney disease, or unspecified chronic kidney disease: Secondary | ICD-10-CM | POA: Diagnosis not present

## 2022-05-29 DIAGNOSIS — R238 Other skin changes: Secondary | ICD-10-CM | POA: Diagnosis not present

## 2022-05-29 DIAGNOSIS — S81802D Unspecified open wound, left lower leg, subsequent encounter: Secondary | ICD-10-CM | POA: Diagnosis not present

## 2022-05-29 DIAGNOSIS — M5441 Lumbago with sciatica, right side: Secondary | ICD-10-CM | POA: Diagnosis not present

## 2022-06-01 DIAGNOSIS — E1151 Type 2 diabetes mellitus with diabetic peripheral angiopathy without gangrene: Secondary | ICD-10-CM | POA: Diagnosis not present

## 2022-06-01 DIAGNOSIS — S81802D Unspecified open wound, left lower leg, subsequent encounter: Secondary | ICD-10-CM | POA: Diagnosis not present

## 2022-06-01 DIAGNOSIS — H269 Unspecified cataract: Secondary | ICD-10-CM | POA: Diagnosis not present

## 2022-06-01 DIAGNOSIS — I129 Hypertensive chronic kidney disease with stage 1 through stage 4 chronic kidney disease, or unspecified chronic kidney disease: Secondary | ICD-10-CM | POA: Diagnosis not present

## 2022-06-01 DIAGNOSIS — M8580 Other specified disorders of bone density and structure, unspecified site: Secondary | ICD-10-CM | POA: Diagnosis not present

## 2022-06-01 DIAGNOSIS — F0284 Dementia in other diseases classified elsewhere, unspecified severity, with anxiety: Secondary | ICD-10-CM | POA: Diagnosis not present

## 2022-06-01 DIAGNOSIS — R238 Other skin changes: Secondary | ICD-10-CM | POA: Diagnosis not present

## 2022-06-01 DIAGNOSIS — M5441 Lumbago with sciatica, right side: Secondary | ICD-10-CM | POA: Diagnosis not present

## 2022-06-01 DIAGNOSIS — I872 Venous insufficiency (chronic) (peripheral): Secondary | ICD-10-CM | POA: Diagnosis not present

## 2022-06-01 DIAGNOSIS — E1122 Type 2 diabetes mellitus with diabetic chronic kidney disease: Secondary | ICD-10-CM | POA: Diagnosis not present

## 2022-06-01 DIAGNOSIS — Z9181 History of falling: Secondary | ICD-10-CM | POA: Diagnosis not present

## 2022-06-01 DIAGNOSIS — F02818 Dementia in other diseases classified elsewhere, unspecified severity, with other behavioral disturbance: Secondary | ICD-10-CM | POA: Diagnosis not present

## 2022-06-01 DIAGNOSIS — M170 Bilateral primary osteoarthritis of knee: Secondary | ICD-10-CM | POA: Diagnosis not present

## 2022-06-01 DIAGNOSIS — Z556 Problems related to health literacy: Secondary | ICD-10-CM | POA: Diagnosis not present

## 2022-06-01 DIAGNOSIS — F5101 Primary insomnia: Secondary | ICD-10-CM | POA: Diagnosis not present

## 2022-06-01 DIAGNOSIS — N184 Chronic kidney disease, stage 4 (severe): Secondary | ICD-10-CM | POA: Diagnosis not present

## 2022-06-01 DIAGNOSIS — G309 Alzheimer's disease, unspecified: Secondary | ICD-10-CM | POA: Diagnosis not present

## 2022-06-06 DIAGNOSIS — S81802D Unspecified open wound, left lower leg, subsequent encounter: Secondary | ICD-10-CM | POA: Diagnosis not present

## 2022-06-06 DIAGNOSIS — R238 Other skin changes: Secondary | ICD-10-CM | POA: Diagnosis not present

## 2022-06-06 DIAGNOSIS — I129 Hypertensive chronic kidney disease with stage 1 through stage 4 chronic kidney disease, or unspecified chronic kidney disease: Secondary | ICD-10-CM | POA: Diagnosis not present

## 2022-06-06 DIAGNOSIS — E1122 Type 2 diabetes mellitus with diabetic chronic kidney disease: Secondary | ICD-10-CM | POA: Diagnosis not present

## 2022-06-06 DIAGNOSIS — N184 Chronic kidney disease, stage 4 (severe): Secondary | ICD-10-CM | POA: Diagnosis not present

## 2022-06-06 DIAGNOSIS — M5441 Lumbago with sciatica, right side: Secondary | ICD-10-CM | POA: Diagnosis not present

## 2022-06-12 DIAGNOSIS — N184 Chronic kidney disease, stage 4 (severe): Secondary | ICD-10-CM | POA: Diagnosis not present

## 2022-06-12 DIAGNOSIS — E1122 Type 2 diabetes mellitus with diabetic chronic kidney disease: Secondary | ICD-10-CM | POA: Diagnosis not present

## 2022-06-12 DIAGNOSIS — M5441 Lumbago with sciatica, right side: Secondary | ICD-10-CM | POA: Diagnosis not present

## 2022-06-12 DIAGNOSIS — S81802D Unspecified open wound, left lower leg, subsequent encounter: Secondary | ICD-10-CM | POA: Diagnosis not present

## 2022-06-12 DIAGNOSIS — I129 Hypertensive chronic kidney disease with stage 1 through stage 4 chronic kidney disease, or unspecified chronic kidney disease: Secondary | ICD-10-CM | POA: Diagnosis not present

## 2022-06-12 DIAGNOSIS — R238 Other skin changes: Secondary | ICD-10-CM | POA: Diagnosis not present

## 2022-06-28 DIAGNOSIS — G4700 Insomnia, unspecified: Secondary | ICD-10-CM | POA: Diagnosis not present

## 2022-06-28 DIAGNOSIS — R110 Nausea: Secondary | ICD-10-CM | POA: Diagnosis not present

## 2022-07-26 ENCOUNTER — Encounter: Payer: Self-pay | Admitting: Podiatry

## 2022-07-26 ENCOUNTER — Ambulatory Visit (INDEPENDENT_AMBULATORY_CARE_PROVIDER_SITE_OTHER): Payer: Medicare Other | Admitting: Podiatry

## 2022-07-26 DIAGNOSIS — M79674 Pain in right toe(s): Secondary | ICD-10-CM

## 2022-07-26 DIAGNOSIS — B351 Tinea unguium: Secondary | ICD-10-CM

## 2022-07-26 DIAGNOSIS — E1151 Type 2 diabetes mellitus with diabetic peripheral angiopathy without gangrene: Secondary | ICD-10-CM | POA: Diagnosis not present

## 2022-07-26 DIAGNOSIS — M79675 Pain in left toe(s): Secondary | ICD-10-CM

## 2022-07-26 DIAGNOSIS — M2012 Hallux valgus (acquired), left foot: Secondary | ICD-10-CM

## 2022-07-26 NOTE — Progress Notes (Signed)
This patient returns to my office for at risk foot care.  This patient requires this care by a professional since this patient will be at risk due to having chronic kidney disease and type 2 diabetes.  This patient is unable to cut nails herself since the patient cannot reach her nails.These nails are painful walking and wearing shoes.  This patient presents for at risk foot care today with her son.  She has painful callus over the bony prominence over big toe joint right foot.  Patient denies drainage.  General Appearance  Alert, conversant and in no acute stress.  Vascular  Dorsalis pedis and posterior tibial  pulses are weakly  palpable  bilaterally.  Capillary return is diminished.  bilaterally. Cold feet bilaterally. Absent digital hair  B/L.  Neurologic  Senn-Weinstein monofilament wire test within normal limits  bilaterally. Muscle power within normal limits bilaterally.  Nails Thick disfigured discolored nails with subungual debris  from hallux to fifth toes bilaterally. No evidence of bacterial infection or drainage bilaterally.  Orthopedic  No limitations of motion  feet .  No crepitus or effusions noted.  No bony pathology or digital deformities noted.  Skin  normotropic skin with no porokeratosis noted bilaterally.  No signs of infections or ulcers noted.   Clavi 3rd digit right foot resolved. Necrotic tissue noted 5th MPJ left foot.    Onychomycosis  Pain in right toes  Pain in left toes   Callus at dorsomedial exostosis right foot.  Callus over 5th MPJ left foot.  Consent was obtained for treatment procedures.   Mechanical debridement of nails 1-5  bilaterally performed with a nail nipper.  Filed with dremel without incident. .   Return office visit  12 weeks                Told patient to return for periodic foot care and evaluation due to potential at risk complications.   Helane Gunther DPM

## 2022-08-30 DIAGNOSIS — F5101 Primary insomnia: Secondary | ICD-10-CM | POA: Diagnosis not present

## 2022-08-30 DIAGNOSIS — E1121 Type 2 diabetes mellitus with diabetic nephropathy: Secondary | ICD-10-CM | POA: Diagnosis not present

## 2022-08-30 DIAGNOSIS — L97921 Non-pressure chronic ulcer of unspecified part of left lower leg limited to breakdown of skin: Secondary | ICD-10-CM | POA: Diagnosis not present

## 2022-08-30 DIAGNOSIS — R269 Unspecified abnormalities of gait and mobility: Secondary | ICD-10-CM | POA: Diagnosis not present

## 2022-08-30 DIAGNOSIS — N1831 Chronic kidney disease, stage 3a: Secondary | ICD-10-CM | POA: Diagnosis not present

## 2022-10-31 ENCOUNTER — Encounter: Payer: Self-pay | Admitting: Podiatry

## 2022-10-31 ENCOUNTER — Ambulatory Visit (INDEPENDENT_AMBULATORY_CARE_PROVIDER_SITE_OTHER): Payer: Medicare Other | Admitting: Podiatry

## 2022-10-31 DIAGNOSIS — M79675 Pain in left toe(s): Secondary | ICD-10-CM | POA: Diagnosis not present

## 2022-10-31 DIAGNOSIS — M2012 Hallux valgus (acquired), left foot: Secondary | ICD-10-CM

## 2022-10-31 DIAGNOSIS — M79674 Pain in right toe(s): Secondary | ICD-10-CM

## 2022-10-31 DIAGNOSIS — E1151 Type 2 diabetes mellitus with diabetic peripheral angiopathy without gangrene: Secondary | ICD-10-CM

## 2022-10-31 DIAGNOSIS — M21622 Bunionette of left foot: Secondary | ICD-10-CM

## 2022-10-31 DIAGNOSIS — Z23 Encounter for immunization: Secondary | ICD-10-CM | POA: Diagnosis not present

## 2022-10-31 DIAGNOSIS — B351 Tinea unguium: Secondary | ICD-10-CM

## 2022-10-31 DIAGNOSIS — M21621 Bunionette of right foot: Secondary | ICD-10-CM

## 2022-10-31 NOTE — Progress Notes (Signed)
This patient returns to my office for at risk foot care.  This patient requires this care by a professional since this patient will be at risk due to having chronic kidney disease and type 2 diabetes.  This patient is unable to cut nails herself since the patient cannot reach her nails.These nails are painful walking and wearing shoes.  This patient presents for at risk foot care today with her son.  She has painful callus over the bony prominence over big toe joint right foot.  Patient denies drainage.  General Appearance  Alert, conversant and in no acute stress.  Vascular  Dorsalis pedis and posterior tibial  pulses are weakly  palpable  bilaterally.  Capillary return is diminished.  bilaterally. Cold feet bilaterally. Absent digital hair  B/L.  Neurologic  Senn-Weinstein monofilament wire test within normal limits  bilaterally. Muscle power within normal limits bilaterally.  Nails Thick disfigured discolored nails with subungual debris  from hallux to fifth toes bilaterally. No evidence of bacterial infection or drainage bilaterally.  Orthopedic  No limitations of motion  feet .  No crepitus or effusions noted.  No bony pathology or digital deformities noted.  Skin  normotropic skin with no porokeratosis noted bilaterally.  No signs of infections or ulcers noted.   Clavi 3rd digit right foot resolved. Necrotic tissue noted 5th MPJ left foot.    Onychomycosis  Pain in right toes  Pain in left toes   Callus at dorsomedial exostosis right foot.  Callus over 5th MPJ left foot.  Consent was obtained for treatment procedures.   Mechanical debridement of nails 1-5  bilaterally performed with a nail nipper.  Filed with dremel without incident. .   Return office visit  12 weeks                Told patient to return for periodic foot care and evaluation due to potential at risk complications.   Helane Gunther DPM

## 2022-11-13 DIAGNOSIS — H04123 Dry eye syndrome of bilateral lacrimal glands: Secondary | ICD-10-CM | POA: Diagnosis not present

## 2022-11-13 DIAGNOSIS — E119 Type 2 diabetes mellitus without complications: Secondary | ICD-10-CM | POA: Diagnosis not present

## 2022-11-13 DIAGNOSIS — H02403 Unspecified ptosis of bilateral eyelids: Secondary | ICD-10-CM | POA: Diagnosis not present

## 2022-11-13 DIAGNOSIS — H2513 Age-related nuclear cataract, bilateral: Secondary | ICD-10-CM | POA: Diagnosis not present

## 2022-12-27 DIAGNOSIS — E1121 Type 2 diabetes mellitus with diabetic nephropathy: Secondary | ICD-10-CM | POA: Diagnosis not present

## 2022-12-27 DIAGNOSIS — L0390 Cellulitis, unspecified: Secondary | ICD-10-CM | POA: Diagnosis not present

## 2022-12-27 DIAGNOSIS — R269 Unspecified abnormalities of gait and mobility: Secondary | ICD-10-CM | POA: Diagnosis not present

## 2022-12-27 DIAGNOSIS — F5104 Psychophysiologic insomnia: Secondary | ICD-10-CM | POA: Diagnosis not present

## 2022-12-27 DIAGNOSIS — N1831 Chronic kidney disease, stage 3a: Secondary | ICD-10-CM | POA: Diagnosis not present

## 2022-12-27 DIAGNOSIS — N63 Unspecified lump in unspecified breast: Secondary | ICD-10-CM | POA: Diagnosis not present

## 2022-12-27 DIAGNOSIS — L97921 Non-pressure chronic ulcer of unspecified part of left lower leg limited to breakdown of skin: Secondary | ICD-10-CM | POA: Diagnosis not present

## 2023-01-16 DIAGNOSIS — Z993 Dependence on wheelchair: Secondary | ICD-10-CM | POA: Diagnosis not present

## 2023-01-16 DIAGNOSIS — Z556 Problems related to health literacy: Secondary | ICD-10-CM | POA: Diagnosis not present

## 2023-01-16 DIAGNOSIS — I129 Hypertensive chronic kidney disease with stage 1 through stage 4 chronic kidney disease, or unspecified chronic kidney disease: Secondary | ICD-10-CM | POA: Diagnosis not present

## 2023-01-16 DIAGNOSIS — F02818 Dementia in other diseases classified elsewhere, unspecified severity, with other behavioral disturbance: Secondary | ICD-10-CM | POA: Diagnosis not present

## 2023-01-16 DIAGNOSIS — L0390 Cellulitis, unspecified: Secondary | ICD-10-CM | POA: Diagnosis not present

## 2023-01-16 DIAGNOSIS — E1122 Type 2 diabetes mellitus with diabetic chronic kidney disease: Secondary | ICD-10-CM | POA: Diagnosis not present

## 2023-01-16 DIAGNOSIS — G309 Alzheimer's disease, unspecified: Secondary | ICD-10-CM | POA: Diagnosis not present

## 2023-01-16 DIAGNOSIS — F5104 Psychophysiologic insomnia: Secondary | ICD-10-CM | POA: Diagnosis not present

## 2023-01-16 DIAGNOSIS — E1151 Type 2 diabetes mellitus with diabetic peripheral angiopathy without gangrene: Secondary | ICD-10-CM | POA: Diagnosis not present

## 2023-01-16 DIAGNOSIS — G4700 Insomnia, unspecified: Secondary | ICD-10-CM | POA: Diagnosis not present

## 2023-01-16 DIAGNOSIS — N1831 Chronic kidney disease, stage 3a: Secondary | ICD-10-CM | POA: Diagnosis not present

## 2023-01-16 DIAGNOSIS — Z9181 History of falling: Secondary | ICD-10-CM | POA: Diagnosis not present

## 2023-01-16 DIAGNOSIS — I872 Venous insufficiency (chronic) (peripheral): Secondary | ICD-10-CM | POA: Diagnosis not present

## 2023-01-23 DIAGNOSIS — G309 Alzheimer's disease, unspecified: Secondary | ICD-10-CM | POA: Diagnosis not present

## 2023-01-23 DIAGNOSIS — F02818 Dementia in other diseases classified elsewhere, unspecified severity, with other behavioral disturbance: Secondary | ICD-10-CM | POA: Diagnosis not present

## 2023-01-23 DIAGNOSIS — G4700 Insomnia, unspecified: Secondary | ICD-10-CM | POA: Diagnosis not present

## 2023-01-23 DIAGNOSIS — I129 Hypertensive chronic kidney disease with stage 1 through stage 4 chronic kidney disease, or unspecified chronic kidney disease: Secondary | ICD-10-CM | POA: Diagnosis not present

## 2023-01-23 DIAGNOSIS — E1151 Type 2 diabetes mellitus with diabetic peripheral angiopathy without gangrene: Secondary | ICD-10-CM | POA: Diagnosis not present

## 2023-01-23 DIAGNOSIS — I872 Venous insufficiency (chronic) (peripheral): Secondary | ICD-10-CM | POA: Diagnosis not present

## 2023-01-25 DIAGNOSIS — I129 Hypertensive chronic kidney disease with stage 1 through stage 4 chronic kidney disease, or unspecified chronic kidney disease: Secondary | ICD-10-CM | POA: Diagnosis not present

## 2023-01-25 DIAGNOSIS — I872 Venous insufficiency (chronic) (peripheral): Secondary | ICD-10-CM | POA: Diagnosis not present

## 2023-01-25 DIAGNOSIS — F02818 Dementia in other diseases classified elsewhere, unspecified severity, with other behavioral disturbance: Secondary | ICD-10-CM | POA: Diagnosis not present

## 2023-01-25 DIAGNOSIS — G4700 Insomnia, unspecified: Secondary | ICD-10-CM | POA: Diagnosis not present

## 2023-01-25 DIAGNOSIS — E1151 Type 2 diabetes mellitus with diabetic peripheral angiopathy without gangrene: Secondary | ICD-10-CM | POA: Diagnosis not present

## 2023-01-25 DIAGNOSIS — G309 Alzheimer's disease, unspecified: Secondary | ICD-10-CM | POA: Diagnosis not present

## 2023-01-27 DIAGNOSIS — K810 Acute cholecystitis: Secondary | ICD-10-CM | POA: Diagnosis not present

## 2023-01-27 DIAGNOSIS — J984 Other disorders of lung: Secondary | ICD-10-CM | POA: Diagnosis not present

## 2023-01-27 DIAGNOSIS — N281 Cyst of kidney, acquired: Secondary | ICD-10-CM | POA: Diagnosis not present

## 2023-01-27 DIAGNOSIS — R918 Other nonspecific abnormal finding of lung field: Secondary | ICD-10-CM | POA: Diagnosis not present

## 2023-01-27 DIAGNOSIS — Z7401 Bed confinement status: Secondary | ICD-10-CM | POA: Diagnosis not present

## 2023-01-27 DIAGNOSIS — Z66 Do not resuscitate: Secondary | ICD-10-CM | POA: Diagnosis not present

## 2023-01-27 DIAGNOSIS — K8020 Calculus of gallbladder without cholecystitis without obstruction: Secondary | ICD-10-CM | POA: Diagnosis not present

## 2023-01-27 DIAGNOSIS — I2510 Atherosclerotic heart disease of native coronary artery without angina pectoris: Secondary | ICD-10-CM | POA: Diagnosis not present

## 2023-01-27 DIAGNOSIS — R4181 Age-related cognitive decline: Secondary | ICD-10-CM | POA: Diagnosis not present

## 2023-01-27 DIAGNOSIS — N200 Calculus of kidney: Secondary | ICD-10-CM | POA: Diagnosis not present

## 2023-01-27 DIAGNOSIS — S81809A Unspecified open wound, unspecified lower leg, initial encounter: Secondary | ICD-10-CM | POA: Diagnosis not present

## 2023-01-27 DIAGNOSIS — Z681 Body mass index (BMI) 19 or less, adult: Secondary | ICD-10-CM | POA: Diagnosis not present

## 2023-01-27 DIAGNOSIS — J9811 Atelectasis: Secondary | ICD-10-CM | POA: Diagnosis not present

## 2023-01-27 DIAGNOSIS — I10 Essential (primary) hypertension: Secondary | ICD-10-CM | POA: Diagnosis not present

## 2023-01-27 DIAGNOSIS — Z7984 Long term (current) use of oral hypoglycemic drugs: Secondary | ICD-10-CM | POA: Diagnosis not present

## 2023-01-27 DIAGNOSIS — I119 Hypertensive heart disease without heart failure: Secondary | ICD-10-CM | POA: Diagnosis not present

## 2023-01-27 DIAGNOSIS — F03C18 Unspecified dementia, severe, with other behavioral disturbance: Secondary | ICD-10-CM | POA: Diagnosis not present

## 2023-01-27 DIAGNOSIS — F0394 Unspecified dementia, unspecified severity, with anxiety: Secondary | ICD-10-CM | POA: Diagnosis not present

## 2023-01-27 DIAGNOSIS — F419 Anxiety disorder, unspecified: Secondary | ICD-10-CM | POA: Diagnosis not present

## 2023-01-27 DIAGNOSIS — R531 Weakness: Secondary | ICD-10-CM | POA: Diagnosis not present

## 2023-01-27 DIAGNOSIS — E1122 Type 2 diabetes mellitus with diabetic chronic kidney disease: Secondary | ICD-10-CM | POA: Diagnosis not present

## 2023-01-27 DIAGNOSIS — R64 Cachexia: Secondary | ICD-10-CM | POA: Diagnosis not present

## 2023-01-27 DIAGNOSIS — R9431 Abnormal electrocardiogram [ECG] [EKG]: Secondary | ICD-10-CM | POA: Diagnosis not present

## 2023-01-27 DIAGNOSIS — S31809S Unspecified open wound of unspecified buttock, sequela: Secondary | ICD-10-CM | POA: Diagnosis not present

## 2023-01-27 DIAGNOSIS — N184 Chronic kidney disease, stage 4 (severe): Secondary | ICD-10-CM | POA: Diagnosis not present

## 2023-01-27 DIAGNOSIS — E860 Dehydration: Secondary | ICD-10-CM | POA: Diagnosis present

## 2023-01-27 DIAGNOSIS — F03C0 Unspecified dementia, severe, without behavioral disturbance, psychotic disturbance, mood disturbance, and anxiety: Secondary | ICD-10-CM | POA: Diagnosis not present

## 2023-01-27 DIAGNOSIS — R0602 Shortness of breath: Secondary | ICD-10-CM | POA: Diagnosis not present

## 2023-01-27 DIAGNOSIS — Z515 Encounter for palliative care: Secondary | ICD-10-CM | POA: Diagnosis not present

## 2023-01-27 DIAGNOSIS — J90 Pleural effusion, not elsewhere classified: Secondary | ICD-10-CM | POA: Diagnosis not present

## 2023-01-27 DIAGNOSIS — R000 Tachycardia, unspecified: Secondary | ICD-10-CM | POA: Diagnosis not present

## 2023-01-27 DIAGNOSIS — I129 Hypertensive chronic kidney disease with stage 1 through stage 4 chronic kidney disease, or unspecified chronic kidney disease: Secondary | ICD-10-CM | POA: Diagnosis not present

## 2023-01-27 DIAGNOSIS — E876 Hypokalemia: Secondary | ICD-10-CM | POA: Diagnosis not present

## 2023-01-27 DIAGNOSIS — N179 Acute kidney failure, unspecified: Secondary | ICD-10-CM | POA: Diagnosis not present

## 2023-01-27 DIAGNOSIS — J189 Pneumonia, unspecified organism: Secondary | ICD-10-CM | POA: Diagnosis not present

## 2023-01-27 DIAGNOSIS — L97529 Non-pressure chronic ulcer of other part of left foot with unspecified severity: Secondary | ICD-10-CM | POA: Diagnosis not present

## 2023-01-27 DIAGNOSIS — S91301S Unspecified open wound, right foot, sequela: Secondary | ICD-10-CM | POA: Diagnosis not present

## 2023-01-27 DIAGNOSIS — L89899 Pressure ulcer of other site, unspecified stage: Secondary | ICD-10-CM | POA: Diagnosis not present

## 2023-01-27 DIAGNOSIS — F05 Delirium due to known physiological condition: Secondary | ICD-10-CM | POA: Diagnosis not present

## 2023-01-27 DIAGNOSIS — K819 Cholecystitis, unspecified: Secondary | ICD-10-CM | POA: Diagnosis not present

## 2023-01-27 DIAGNOSIS — Z7189 Other specified counseling: Secondary | ICD-10-CM | POA: Diagnosis not present

## 2023-01-27 DIAGNOSIS — E870 Hyperosmolality and hypernatremia: Secondary | ICD-10-CM | POA: Diagnosis not present

## 2023-01-27 DIAGNOSIS — R0989 Other specified symptoms and signs involving the circulatory and respiratory systems: Secondary | ICD-10-CM | POA: Diagnosis not present

## 2023-01-27 DIAGNOSIS — M19072 Primary osteoarthritis, left ankle and foot: Secondary | ICD-10-CM | POA: Diagnosis not present

## 2023-01-27 DIAGNOSIS — E871 Hypo-osmolality and hyponatremia: Secondary | ICD-10-CM | POA: Diagnosis not present

## 2023-01-27 DIAGNOSIS — M2012 Hallux valgus (acquired), left foot: Secondary | ICD-10-CM | POA: Diagnosis not present

## 2023-01-27 DIAGNOSIS — S81801A Unspecified open wound, right lower leg, initial encounter: Secondary | ICD-10-CM | POA: Diagnosis not present

## 2023-01-27 DIAGNOSIS — S91302S Unspecified open wound, left foot, sequela: Secondary | ICD-10-CM | POA: Diagnosis not present

## 2023-01-27 DIAGNOSIS — F0390 Unspecified dementia without behavioral disturbance: Secondary | ICD-10-CM | POA: Diagnosis not present

## 2023-01-27 DIAGNOSIS — N189 Chronic kidney disease, unspecified: Secondary | ICD-10-CM | POA: Diagnosis not present

## 2023-01-27 DIAGNOSIS — S81802A Unspecified open wound, left lower leg, initial encounter: Secondary | ICD-10-CM | POA: Diagnosis present

## 2023-01-27 DIAGNOSIS — R627 Adult failure to thrive: Secondary | ICD-10-CM | POA: Diagnosis present

## 2023-01-27 DIAGNOSIS — R1110 Vomiting, unspecified: Secondary | ICD-10-CM | POA: Diagnosis not present

## 2023-01-27 DIAGNOSIS — K5900 Constipation, unspecified: Secondary | ICD-10-CM | POA: Diagnosis not present

## 2023-01-27 DIAGNOSIS — I4510 Unspecified right bundle-branch block: Secondary | ICD-10-CM | POA: Diagnosis not present

## 2023-01-27 DIAGNOSIS — J168 Pneumonia due to other specified infectious organisms: Secondary | ICD-10-CM | POA: Diagnosis not present

## 2023-01-27 DIAGNOSIS — Z1152 Encounter for screening for COVID-19: Secondary | ICD-10-CM | POA: Diagnosis not present

## 2023-01-27 DIAGNOSIS — M419 Scoliosis, unspecified: Secondary | ICD-10-CM | POA: Diagnosis not present

## 2023-01-27 DIAGNOSIS — N390 Urinary tract infection, site not specified: Secondary | ICD-10-CM | POA: Diagnosis not present

## 2023-01-27 DIAGNOSIS — S81801S Unspecified open wound, right lower leg, sequela: Secondary | ICD-10-CM | POA: Diagnosis not present

## 2023-01-27 DIAGNOSIS — N63 Unspecified lump in unspecified breast: Secondary | ICD-10-CM | POA: Diagnosis not present

## 2023-01-27 DIAGNOSIS — I679 Cerebrovascular disease, unspecified: Secondary | ICD-10-CM | POA: Diagnosis not present

## 2023-01-27 DIAGNOSIS — S81802S Unspecified open wound, left lower leg, sequela: Secondary | ICD-10-CM | POA: Diagnosis not present

## 2023-01-27 DIAGNOSIS — M4856XA Collapsed vertebra, not elsewhere classified, lumbar region, initial encounter for fracture: Secondary | ICD-10-CM | POA: Diagnosis not present

## 2023-01-27 DIAGNOSIS — I517 Cardiomegaly: Secondary | ICD-10-CM | POA: Diagnosis not present

## 2023-01-27 DIAGNOSIS — L97511 Non-pressure chronic ulcer of other part of right foot limited to breakdown of skin: Secondary | ICD-10-CM | POA: Diagnosis not present

## 2023-01-27 DIAGNOSIS — R112 Nausea with vomiting, unspecified: Secondary | ICD-10-CM | POA: Diagnosis not present

## 2023-01-27 DIAGNOSIS — J690 Pneumonitis due to inhalation of food and vomit: Secondary | ICD-10-CM | POA: Diagnosis not present

## 2023-01-27 DIAGNOSIS — R410 Disorientation, unspecified: Secondary | ICD-10-CM | POA: Diagnosis not present

## 2023-01-27 DIAGNOSIS — L97519 Non-pressure chronic ulcer of other part of right foot with unspecified severity: Secondary | ICD-10-CM | POA: Diagnosis not present

## 2023-01-27 DIAGNOSIS — K7689 Other specified diseases of liver: Secondary | ICD-10-CM | POA: Diagnosis not present

## 2023-01-27 DIAGNOSIS — K8000 Calculus of gallbladder with acute cholecystitis without obstruction: Secondary | ICD-10-CM | POA: Diagnosis not present

## 2023-01-27 DIAGNOSIS — R52 Pain, unspecified: Secondary | ICD-10-CM | POA: Diagnosis not present

## 2023-01-27 DIAGNOSIS — S32020A Wedge compression fracture of second lumbar vertebra, initial encounter for closed fracture: Secondary | ICD-10-CM | POA: Diagnosis not present

## 2023-01-27 DIAGNOSIS — S32020S Wedge compression fracture of second lumbar vertebra, sequela: Secondary | ICD-10-CM | POA: Diagnosis not present

## 2023-01-31 ENCOUNTER — Ambulatory Visit: Payer: Medicare Other | Admitting: Podiatry

## 2023-02-09 DIAGNOSIS — R4181 Age-related cognitive decline: Secondary | ICD-10-CM | POA: Diagnosis not present

## 2023-02-09 DIAGNOSIS — S81802S Unspecified open wound, left lower leg, sequela: Secondary | ICD-10-CM | POA: Diagnosis not present

## 2023-02-09 DIAGNOSIS — S32020S Wedge compression fracture of second lumbar vertebra, sequela: Secondary | ICD-10-CM | POA: Diagnosis not present

## 2023-02-09 DIAGNOSIS — J690 Pneumonitis due to inhalation of food and vomit: Secondary | ICD-10-CM | POA: Diagnosis not present

## 2023-02-09 DIAGNOSIS — S91302S Unspecified open wound, left foot, sequela: Secondary | ICD-10-CM | POA: Diagnosis not present

## 2023-02-09 DIAGNOSIS — E870 Hyperosmolality and hypernatremia: Secondary | ICD-10-CM | POA: Diagnosis not present

## 2023-02-09 DIAGNOSIS — S81801S Unspecified open wound, right lower leg, sequela: Secondary | ICD-10-CM | POA: Diagnosis not present

## 2023-02-09 DIAGNOSIS — I517 Cardiomegaly: Secondary | ICD-10-CM | POA: Diagnosis not present

## 2023-02-09 DIAGNOSIS — K5900 Constipation, unspecified: Secondary | ICD-10-CM | POA: Diagnosis not present

## 2023-02-09 DIAGNOSIS — L97519 Non-pressure chronic ulcer of other part of right foot with unspecified severity: Secondary | ICD-10-CM | POA: Diagnosis not present

## 2023-02-09 DIAGNOSIS — F03C18 Unspecified dementia, severe, with other behavioral disturbance: Secondary | ICD-10-CM | POA: Diagnosis not present

## 2023-02-09 DIAGNOSIS — E1122 Type 2 diabetes mellitus with diabetic chronic kidney disease: Secondary | ICD-10-CM | POA: Diagnosis not present

## 2023-02-09 DIAGNOSIS — I679 Cerebrovascular disease, unspecified: Secondary | ICD-10-CM | POA: Diagnosis not present

## 2023-02-09 DIAGNOSIS — I129 Hypertensive chronic kidney disease with stage 1 through stage 4 chronic kidney disease, or unspecified chronic kidney disease: Secondary | ICD-10-CM | POA: Diagnosis not present

## 2023-02-09 DIAGNOSIS — K8000 Calculus of gallbladder with acute cholecystitis without obstruction: Secondary | ICD-10-CM | POA: Diagnosis not present

## 2023-02-09 DIAGNOSIS — F419 Anxiety disorder, unspecified: Secondary | ICD-10-CM | POA: Diagnosis not present

## 2023-02-09 DIAGNOSIS — S91301S Unspecified open wound, right foot, sequela: Secondary | ICD-10-CM | POA: Diagnosis not present

## 2023-02-09 DIAGNOSIS — R52 Pain, unspecified: Secondary | ICD-10-CM | POA: Diagnosis not present

## 2023-02-09 DIAGNOSIS — R1110 Vomiting, unspecified: Secondary | ICD-10-CM | POA: Diagnosis not present

## 2023-02-09 DIAGNOSIS — N184 Chronic kidney disease, stage 4 (severe): Secondary | ICD-10-CM | POA: Diagnosis not present

## 2023-02-09 DIAGNOSIS — S31809S Unspecified open wound of unspecified buttock, sequela: Secondary | ICD-10-CM | POA: Diagnosis not present

## 2023-02-09 DIAGNOSIS — J90 Pleural effusion, not elsewhere classified: Secondary | ICD-10-CM | POA: Diagnosis not present

## 2023-03-09 DEATH — deceased
